# Patient Record
Sex: Female | Born: 1950 | Race: White | Hispanic: No | Marital: Single | Smoking: Never smoker
Health system: Southern US, Community
[De-identification: ages and names within clinical notes are randomized; demographics above are authoritative.]

## PROBLEM LIST (undated history)

## (undated) DIAGNOSIS — K5790 Diverticulosis of intestine, part unspecified, without perforation or abscess without bleeding: Secondary | ICD-10-CM

## (undated) DIAGNOSIS — Z8701 Personal history of pneumonia (recurrent): Secondary | ICD-10-CM

## (undated) DIAGNOSIS — K219 Gastro-esophageal reflux disease without esophagitis: Secondary | ICD-10-CM

## (undated) HISTORY — DX: Diverticulosis of intestine, part unspecified, without perforation or abscess without bleeding: K57.90

## (undated) HISTORY — DX: Gastro-esophageal reflux disease without esophagitis: K21.9

---
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
## 1989-08-08 HISTORY — PX: RETINAL DETACHMENT SURGERY: SHX105

## 1989-08-08 HISTORY — PX: EYE SURGERY: SHX253

## 2000-03-20 ENCOUNTER — Encounter: Payer: Self-pay | Admitting: *Deleted

## 2000-03-20 ENCOUNTER — Inpatient Hospital Stay (HOSPITAL_COMMUNITY): Admission: AD | Admit: 2000-03-20 | Discharge: 2000-03-20 | Payer: Self-pay | Admitting: *Deleted

## 2000-04-06 ENCOUNTER — Encounter (INDEPENDENT_AMBULATORY_CARE_PROVIDER_SITE_OTHER): Payer: Self-pay

## 2000-04-06 ENCOUNTER — Other Ambulatory Visit: Admission: RE | Admit: 2000-04-06 | Discharge: 2000-04-06 | Payer: Self-pay | Admitting: *Deleted

## 2006-07-20 ENCOUNTER — Ambulatory Visit (HOSPITAL_COMMUNITY): Admission: RE | Admit: 2006-07-20 | Discharge: 2006-07-20 | Payer: Self-pay | Admitting: Family Medicine

## 2006-08-08 HISTORY — PX: CATARACT EXTRACTION: SUR2

## 2009-08-06 ENCOUNTER — Ambulatory Visit: Payer: Self-pay | Admitting: Internal Medicine

## 2009-08-06 DIAGNOSIS — K5732 Diverticulitis of large intestine without perforation or abscess without bleeding: Secondary | ICD-10-CM | POA: Insufficient documentation

## 2009-08-06 LAB — CONVERTED CEMR LAB
BUN: 10 mg/dL (ref 6–23)
Basophils Absolute: 0.2 10*3/uL — ABNORMAL HIGH (ref 0.0–0.1)
Basophils Relative: 1.7 % (ref 0.0–3.0)
CO2: 27 meq/L (ref 19–32)
Calcium: 9.4 mg/dL (ref 8.4–10.5)
Chloride: 108 meq/L (ref 96–112)
Creatinine, Ser: 1 mg/dL (ref 0.4–1.2)
Eosinophils Absolute: 0.1 10*3/uL (ref 0.0–0.7)
Eosinophils Relative: 0.6 % (ref 0.0–5.0)
GFR calc non Af Amer: 60.32 mL/min (ref >60)
Glucose, Bld: 110 mg/dL — ABNORMAL HIGH (ref 70–99)
HCT: 46.1 % — ABNORMAL HIGH (ref 36.0–46.0)
Hemoglobin: 15.7 g/dL — ABNORMAL HIGH (ref 12.0–15.0)
Lymphocytes Relative: 13.4 % (ref 12.0–46.0)
Lymphs Abs: 1.8 10*3/uL (ref 0.7–4.0)
MCHC: 34.1 g/dL (ref 30.0–36.0)
MCV: 96.6 fL (ref 78.0–100.0)
Monocytes Absolute: 1 10*3/uL (ref 0.1–1.0)
Monocytes Relative: 7.4 % (ref 3.0–12.0)
Neutro Abs: 10.4 10*3/uL — ABNORMAL HIGH (ref 1.4–7.7)
Neutrophils Relative %: 76.9 % (ref 43.0–77.0)
Platelets: 234 10*3/uL (ref 150.0–400.0)
Potassium: 4 meq/L (ref 3.5–5.1)
RBC: 4.78 M/uL (ref 3.87–5.11)
RDW: 11.8 % (ref 11.5–14.6)
Sodium: 143 meq/L (ref 135–145)
WBC: 13.5 10*3/uL — ABNORMAL HIGH (ref 4.5–10.5)

## 2009-10-28 ENCOUNTER — Ambulatory Visit: Payer: Self-pay | Admitting: Internal Medicine

## 2009-10-29 LAB — CONVERTED CEMR LAB
ALT: 32 units/L (ref 0–35)
AST: 25 units/L (ref 0–37)
Albumin: 3.8 g/dL (ref 3.5–5.2)
Alkaline Phosphatase: 74 units/L (ref 39–117)
BUN: 13 mg/dL (ref 6–23)
Basophils Absolute: 0 10*3/uL (ref 0.0–0.1)
Basophils Relative: 0.5 % (ref 0.0–3.0)
Bilirubin Urine: NEGATIVE
Bilirubin, Direct: 0.1 mg/dL (ref 0.0–0.3)
CO2: 26 meq/L (ref 19–32)
Calcium: 9.2 mg/dL (ref 8.4–10.5)
Chloride: 110 meq/L (ref 96–112)
Cholesterol: 174 mg/dL (ref 0–200)
Creatinine, Ser: 1.1 mg/dL (ref 0.4–1.2)
Eosinophils Absolute: 0.1 10*3/uL (ref 0.0–0.7)
Eosinophils Relative: 1.7 % (ref 0.0–5.0)
GFR calc non Af Amer: 54 mL/min (ref >60)
Glucose, Bld: 129 mg/dL — ABNORMAL HIGH (ref 70–99)
HCT: 43.5 % (ref 36.0–46.0)
HDL: 47.9 mg/dL (ref >39.00)
Hemoglobin: 14.7 g/dL (ref 12.0–15.0)
Ketones, ur: NEGATIVE mg/dL
LDL Cholesterol: 107 mg/dL — ABNORMAL HIGH (ref 0–99)
Leukocytes, UA: NEGATIVE
Lymphocytes Relative: 34.9 % (ref 12.0–46.0)
Lymphs Abs: 2 10*3/uL (ref 0.7–4.0)
MCHC: 33.7 g/dL (ref 30.0–36.0)
MCV: 97.8 fL (ref 78.0–100.0)
Monocytes Absolute: 0.5 10*3/uL (ref 0.1–1.0)
Monocytes Relative: 8.4 % (ref 3.0–12.0)
Neutro Abs: 3.2 10*3/uL (ref 1.4–7.7)
Neutrophils Relative %: 54.5 % (ref 43.0–77.0)
Nitrite: NEGATIVE
Platelets: 210 10*3/uL (ref 150.0–400.0)
Potassium: 4.2 meq/L (ref 3.5–5.1)
RBC: 4.45 M/uL (ref 3.87–5.11)
RDW: 11.6 % (ref 11.5–14.6)
Sodium: 141 meq/L (ref 135–145)
Specific Gravity, Urine: 1.01 (ref 1.000–1.030)
TSH: 2.97 microintl units/mL (ref 0.35–5.50)
Total Bilirubin: 0.5 mg/dL (ref 0.3–1.2)
Total CHOL/HDL Ratio: 4
Total Protein, Urine: NEGATIVE mg/dL
Total Protein: 6.9 g/dL (ref 6.0–8.3)
Triglycerides: 96 mg/dL (ref 0.0–149.0)
Urine Glucose: NEGATIVE mg/dL
Urobilinogen, UA: 0.2 (ref 0.0–1.0)
VLDL: 19.2 mg/dL (ref 0.0–40.0)
WBC: 5.8 10*3/uL (ref 4.5–10.5)
pH: 6 (ref 5.0–8.0)

## 2009-11-04 ENCOUNTER — Ambulatory Visit: Payer: Self-pay | Admitting: Internal Medicine

## 2009-11-04 LAB — CONVERTED CEMR LAB: Hgb A1c MFr Bld: 6.2 % (ref 4.6–6.5)

## 2009-11-06 ENCOUNTER — Encounter (INDEPENDENT_AMBULATORY_CARE_PROVIDER_SITE_OTHER): Payer: Self-pay | Admitting: *Deleted

## 2010-01-27 ENCOUNTER — Ambulatory Visit: Payer: Self-pay | Admitting: Internal Medicine

## 2010-03-04 ENCOUNTER — Ambulatory Visit: Payer: Self-pay | Admitting: Internal Medicine

## 2010-03-04 LAB — CONVERTED CEMR LAB: Hgb A1c MFr Bld: 6 % (ref 4.6–6.5)

## 2010-03-10 ENCOUNTER — Encounter: Admission: RE | Admit: 2010-03-10 | Discharge: 2010-05-06 | Payer: Self-pay | Admitting: Internal Medicine

## 2010-03-10 ENCOUNTER — Encounter: Payer: Self-pay | Admitting: Internal Medicine

## 2010-06-10 ENCOUNTER — Telehealth: Payer: Self-pay | Admitting: Internal Medicine

## 2010-06-10 ENCOUNTER — Ambulatory Visit: Payer: Self-pay | Admitting: Internal Medicine

## 2010-06-10 LAB — CONVERTED CEMR LAB: Hgb A1c MFr Bld: 6 % (ref 4.6–6.5)

## 2010-06-15 ENCOUNTER — Ambulatory Visit: Payer: Self-pay | Admitting: Internal Medicine

## 2010-06-15 DIAGNOSIS — E119 Type 2 diabetes mellitus without complications: Secondary | ICD-10-CM | POA: Insufficient documentation

## 2010-09-07 NOTE — Assessment & Plan Note (Signed)
Summary: 3 mth fu--stc   Vital Signs:  Patient profile:   60 year old female Height:      66 inches (167.64 cm) Weight:      235.0 pounds (106.82 kg) O2 Sat:      97 % on Room air Temp:     97.3 degrees F (36.28 degrees C) oral Pulse rate:   69 / minute BP sitting:   124 / 78  (left arm) Cuff size:   large  Vitals Entered By: Orlan Leavens (January 27, 2010 8:41 AM)  O2 Flow:  Room air CC: 3 month follow-up Is Patient Diabetic? No Pain Assessment Patient in pain? no        Primary Care Provider:  Newt Lukes MD  CC:  3 month follow-up.  History of Present Illness: here for followup -   1) hyperglycemia -  noted high fasting glc on CPX labs 10/2009-  value meets criteria for DM -  +FH hx same -  +weight loss efforts and diet reviewed -   2) ?about zostavax   Preventive Screening-Counseling & Management  Alcohol-Tobacco     Alcohol drinks/day: 0     Alcohol Counseling: not indicated; patient does not drink     Smoking Status: never     Tobacco Counseling: not indicated; no tobacco use  Caffeine-Diet-Exercise     Diet Counseling: to improve diet; diet is suboptimal     Does Patient Exercise: no     Exercise Counseling: to improve exercise regimen     Depression Counseling: not indicated; screening negative for depression  Safety-Violence-Falls     Seat Belt Use: yes     Helmet Use: n/a     Firearms in the Home: no firearms in the home     Smoke Detectors: yes     Violence in the Home: no risk noted     Sexual Abuse: no     Fall Risk Counseling: not indicated; no significant falls noted  Clinical Review Panels:  Lipid Management   Cholesterol:  174 (10/28/2009)   LDL (bad choesterol):  107 (10/28/2009)   HDL (good cholesterol):  47.90 (10/28/2009)  Diabetes Management   HgBA1C:  6.2 (11/04/2009)   Creatinine:  1.1 (10/28/2009)   Current Medications (verified): 1)  Multivitamins  Tabs (Multiple Vitamin) .... Once Daily 2)  Calcium-Vitamin D  600-200 Mg-Unit Caps (Calcium Carbonate-Vitamin D) .... Once Daily 3)  Probiotic  Caps (Probiotic Product) .... Take 1 By Mouth Qd 4)  Natural Water Pills  Tabs (Bucalfaspkgluccouchparsuvaurju) .... Take Prn  Allergies (verified): 1)  ! Novocain  Past History:  Past Medical History: cataracts, hx of hx diverticulosis, recurrent bordeline diabetes  Review of Systems  The patient denies anorexia, weight gain, hoarseness, chest pain, and headaches.    Physical Exam  General:  overweight-appearing.  alert, well-developed, well-nourished, and cooperative to examination.    Eyes:  vision grossly intact; pupils equal, round and reactive to light.  conjunctiva and lids normal.    Ears:  normal pinnae bilaterally, without erythema, swelling, or tenderness to palpation. TMs clear, without effusion, or cerumen impaction. Hearing grossly normal bilaterally  Mouth:  teeth and gums in good repair; mucous membranes moist, without lesions or ulcers. oropharynx clear without exudate, no erythema.  Lungs:  normal respiratory effort, no intercostal retractions or use of accessory muscles; normal breath sounds bilaterally - no crackles and no wheezes.    Heart:  normal rate, regular rhythm, no murmur, and no rub. BLE trace  edema ("fatty ankles").    Impression & Recommendations:  Problem # 1:  FASTING HYPERGLYCEMIA (ICD-790.29)  prior a1c 6.2 - borderline DM - pt working on weight loss - down 11 lbs in 3 mos - recheck in July and refer to nutrition  Labs Reviewed: Creat: 1.1 (10/28/2009)     Orders: Nutrition Referral (Nutrition)  Problem # 2:  other Time spent with patient 25 minutes, more than 50% of this time was spent counseling patient on diet and caloric needs for weight loss and carb restriction  Complete Medication List: 1)  Multivitamins Tabs (Multiple vitamin) .... Once daily 2)  Calcium-vitamin D 600-200 Mg-unit Caps (Calcium carbonate-vitamin d) .... Once daily 3)  Probiotic  Caps (Probiotic product) .... Take 1 by mouth qd 4)  Natural Water Pills Tabs (Bucalfaspkgluccouchparsuvaurju) .... Take prn  Patient Instructions: 1)  it was good to see you today. 2)  we'll make referral to nutrition for education (in addition to rough outline given to you today). Our office will contact you regarding this appointment once made.  3)  return for lab only in July (a1c) - your results will be posted on the phone tree for review in 48-72 hours from the time of test completion; call [phone] and enter your 9 digit MRN (listed above on this page, just below your name); if any changes need to be made or there are abnormal results, you will be contacted directly.  4)  Please schedule a follow-up appointment in Nov-Dec to monitor weight and sugars, call sooner if problems.

## 2010-09-07 NOTE — Assessment & Plan Note (Signed)
Summary: NOV-DEC FU D/T--STC   Vital Signs:  Patient profile:   60 year old female Height:      66 inches (167.64 cm) Weight:      230.0 pounds (104.55 kg) BMI:     37.26 O2 Sat:      97 % on Room air Temp:     98.5 degrees F (36.94 degrees C) oral Pulse rate:   63 / minute BP sitting:   118 / 72  (left arm) Cuff size:   large  Vitals Entered By: Orlan Leavens RMA (June 15, 2010 9:13 AM)  O2 Flow:  Room air CC: 3-4 month follow-up Is Patient Diabetic? No Pain Assessment Patient in pain? no        Primary Care Provider:  Newt Lukes MD  CC:  3-4 month follow-up.  History of Present Illness: here for followup -   1) diet controlled DM2 dx by high fasting glc on CPX labs 10/2009-  +FH hx same -  +weight loss efforts and diet reviewed -      Current Medications (verified): 1)  Multivitamins  Tabs (Multiple Vitamin) .... Once Daily 2)  Calcium-Vitamin D 600-200 Mg-Unit Caps (Calcium Carbonate-Vitamin D) .... Once Daily 3)  Probiotic  Caps (Probiotic Product) .... Take 1 By Mouth Qd 4)  Natural Water Pills  Tabs (Bucalfaspkgluccouchparsuvaurju) .... Take Prn  Allergies (verified): 1)  ! Novocain  Past History:  Past Medical History: cataracts, hx of hx diverticulosis, recurrent diabetes type 2, diet controlled  Review of Systems  The patient denies weight gain, chest pain, syncope, headaches, and abdominal pain.    Physical Exam  General:  overweight-appearing.  alert, well-developed, well-nourished, and cooperative to examination.    Lungs:  normal respiratory effort, no intercostal retractions or use of accessory muscles; normal breath sounds bilaterally - no crackles and no wheezes.    Heart:  normal rate, regular rhythm, no murmur, and no rub. BLE trace edema ("fatty ankles").    Impression & Recommendations:  Problem # 1:  DIABETES MELLITUS, TYPE II, CONTROLLED (ICD-250.00)  diet controlled and improved since dx 10/2009 when a1c 6.2 cont  diet and exercsie efforts - good meeting with nutritionist  Labs Reviewed: Creat: 1.1 (10/28/2009)    Reviewed HgBA1c results: 6.0 (06/10/2010)  6.0 (03/04/2010)  Complete Medication List: 1)  Multivitamins Tabs (Multiple vitamin) .... Once daily 2)  Calcium-vitamin D 600-200 Mg-unit Caps (Calcium carbonate-vitamin d) .... Once daily 3)  Probiotic Caps (Probiotic product) .... Take 1 by mouth qd 4)  Natural Water Pills Tabs (Bucalfaspkgluccouchparsuvaurju) .... Take prn  Patient Instructions: 1)  it was good to see you today. 2)  you are doing well with your weight loss - keep up the good work and try to pick up the exercise effort - walk the dogs, etc 3)  Please schedule a follow-up appointment in 3 months to monitor weight and sugars, call sooner if problems.  will do lab at that visit   Orders Added: 1)  Est. Patient Level III [16109]

## 2010-09-07 NOTE — Progress Notes (Signed)
Summary: lab orders?  Phone Note From Other Clinic   Caller: Vicki/ lab/ [phone] Request: Talk with Nurse Summary of Call: Pt is down in the lab requesting blood work no orders in comp.  Initial call taken by: Orlan Leavens RMA,  June 10, 2010 8:28 AM  Follow-up for Phone Call        d/w Valentina Gu - will check a1c (790.29) - already entered into IDX - plan to review with pt at upcoming OV Follow-up by: Newt Lukes MD,  June 10, 2010 8:48 AM

## 2010-09-07 NOTE — Assessment & Plan Note (Signed)
Summary: PHYSICAL--STC   Vital Signs:  Patient profile:   60 year old female Height:      66 inches (167.64 cm) Weight:      246.8 pounds (112.18 kg) O2 Sat:      94 % on Room air Temp:     97.5 degrees F (36.39 degrees C) oral Pulse rate:   94 / minute BP sitting:   110 / 72  (left arm) Cuff size:   large  Vitals Entered By: Orlan Leavens (November 04, 2009 8:25 AM)  O2 Flow:  Room air CC: CPX Is Patient Diabetic? No Pain Assessment Patient in pain? no        Primary Care Provider:  Newt Lukes MD  CC:  CPX.  History of Present Illness: patient is here today for annual physical. Patient feels well and has no complaints.   noted high fasting glc on CPX labs -  value meets criteria for DM -  +FH hx same -  +weight gain and poor diet reviewed -  ?re: borderline DM prev  Preventive Screening-Counseling & Management  Alcohol-Tobacco     Alcohol drinks/day: 0     Alcohol Counseling: not indicated; patient does not drink     Smoking Status: never     Tobacco Counseling: not indicated; no tobacco use  Caffeine-Diet-Exercise     Diet Counseling: to improve diet; diet is suboptimal     Does Patient Exercise: no     Exercise Counseling: to improve exercise regimen     Depression Counseling: not indicated; screening negative for depression  Safety-Violence-Falls     Seat Belt Use: yes     Helmet Use: n/a     Firearms in the Home: no firearms in the home     Smoke Detectors: yes     Violence in the Home: no risk noted     Sexual Abuse: no     Fall Risk Counseling: not indicated; no significant falls noted  Clinical Review Panels:  Immunizations   Last Tetanus Booster:  Tdap (11/04/2009)  Lipid Management   Cholesterol:  174 (10/28/2009)   LDL (bad choesterol):  107 (10/28/2009)   HDL (good cholesterol):  47.90 (10/28/2009)  CBC   WBC:  5.8 (10/28/2009)   RBC:  4.45 (10/28/2009)   Hgb:  14.7 (10/28/2009)   Hct:  43.5 (10/28/2009)   Platelets:  210.0  (10/28/2009)   MCV  97.8 (10/28/2009)   MCHC  33.7 (10/28/2009)   RDW  11.6 (10/28/2009)   PMN:  54.5 (10/28/2009)   Lymphs:  34.9 (10/28/2009)   Monos:  8.4 (10/28/2009)   Eosinophils:  1.7 (10/28/2009)   Basophil:  0.5 (10/28/2009)  Complete Metabolic Panel   Glucose:  129 (10/28/2009)   Sodium:  141 (10/28/2009)   Potassium:  4.2 (10/28/2009)   Chloride:  110 (10/28/2009)   CO2:  26 (10/28/2009)   BUN:  13 (10/28/2009)   Creatinine:  1.1 (10/28/2009)   Albumin:  3.8 (10/28/2009)   Total Protein:  6.9 (10/28/2009)   Calcium:  9.2 (10/28/2009)   Total Bili:  0.5 (10/28/2009)   Alk Phos:  74 (10/28/2009)   SGPT (ALT):  32 (10/28/2009)   SGOT (AST):  25 (10/28/2009)   Current Medications (verified): 1)  Multivitamins  Tabs (Multiple Vitamin) .... Once Daily 2)  Calcium-Vitamin D 600-200 Mg-Unit Caps (Calcium Carbonate-Vitamin D) .... Once Daily 3)  Probiotic  Caps (Probiotic Product) .... Take 1 By Mouth Qd 4)  Natural Water Pills  Tabs (  Bucalfaspkgluccouchparsuvaurju) .... Take Prn  Allergies (verified): 1)  ! Novocain  Past History:  Past Medical History: cataracts, hx of hx diverticulosis, recurrent  Past Surgical History: Eye surgery, detached retina 1991 Cataract extraction (2008) Tonsillectomy (1957)     Family History: Family History of Arthritis (parent,grandparent) Family History Diabetes 1st degree relative (grandparent) Family History Lung cancer (parent)  dad expired age39y - lung cancer (worked Designer, fashion/clothing), melanoma mom -[employer] - OA (?RA), DM2     Social History: Never Smoked   no alcohol single - lives alone, 2 dogs works in Airline pilot, Copy for IT trainer [employer] Does Patient Exercise:  no Risk analyst Use:  yes  Review of Systems       see HPI above. I have reviewed all other systems and they were negative.   Physical Exam  General:  overweight-appearing.  alert, well-developed, well-nourished, and cooperative to examination.      Head:  Normocephalic and atraumatic without obvious abnormalities. No apparent alopecia or balding. Eyes:  vision grossly intact; pupils equal, round and reactive to light.  conjunctiva and lids normal.    Ears:  normal pinnae bilaterally, without erythema, swelling, or tenderness to palpation. TMs clear, without effusion, or cerumen impaction. Hearing grossly normal bilaterally  Mouth:  teeth and gums in good repair; mucous membranes moist, without lesions or ulcers. oropharynx clear without exudate, no erythema.  Neck:  supple, full ROM, no masses, no thyromegaly; no thyroid nodules or tenderness. no JVD or carotid bruits.   Lungs:  normal respiratory effort, no intercostal retractions or use of accessory muscles; normal breath sounds bilaterally - no crackles and no wheezes.    Heart:  normal rate, regular rhythm, no murmur, and no rub. BLE trace edema ("fatty ankles"). normal DP pulses and normal cap refill in all 4 extremities    Abdomen:  obese, soft, non-tender, normal bowel sounds, no distention; no masses and no appreciable hepatomegaly or splenomegaly.   Genitalia:  defer at pt request Msk:  No deformity or scoliosis noted of thoracic or lumbar spine.   Neurologic:  alert & oriented X3 and cranial nerves II-XII symetrically intact.  strength normal in all extremities, sensation intact to light touch, and gait normal. speech fluent without dysarthria or aphasia; follows commands with good comprehension.  Skin:  no rashes, vesicles, ulcers, or erythema. No nodules or irregularity to palpation.  Psych:  Oriented X3, memory intact for recent and remote, normally interactive, good eye contact, not anxious appearing, not depressed appearing, and not agitated.      Impression & Recommendations:  Problem # 1:  PREVENTIVE HEALTH CARE (ICD-V70.0) Patient has been counseled on age-appropriate routine health concerns for screening and prevention. These are reviewed and up-to-date. Immunizations are  up-to-date or declined. Labs and ECG reviewed. refer for colo, declines mammo/PAP Orders: EKG w/ Interpretation (93000) Gastroenterology Referral (GI)  Problem # 2:  FASTING HYPERGLYCEMIA (ICD-790.29)  meets criteria for DM - education provided on diet and need for weight loss -  wished to avoid meds at this time check A1c and reck 3 mos  Orders: TLB-A1C / Hgb A1C (Glycohemoglobin) (83036-A1C)  Complete Medication List: 1)  Multivitamins Tabs (Multiple vitamin) .... Once daily 2)  Calcium-vitamin D 600-200 Mg-unit Caps (Calcium carbonate-vitamin d) .... Once daily 3)  Probiotic Caps (Probiotic product) .... Take 1 by mouth qd 4)  Natural Water Pills Tabs (Bucalfaspkgluccouchparsuvaurju) .... Take prn  Other Orders: Tdap => 12yrs IM (42595) Admin 1st Vaccine (63875)  Contraindications/Deferment of Procedures/Staging:  Test/Procedure: Mammogram    Reason for deferment: patient declined     Test/Procedure: PAP Smear    Reason for deferment: patient declined   Patient Instructions: 1)  it was good to see you today. 2)  exam and EKG look good - high sugar meets criteria for diabetes -  will check A1c now to confirm same and call you with results 3)  it is important that you work on losing weight - monitor your diet and consume fewer calories such as less carbohydrates (sugar) and less fat. you also need to increase your physical activity level - start by walking for 10-20 minutes 3 times per week and work up to 30 minutes 4-5 times each week.  4)  we'll make referral for colonoscopy. Our office will contact you regarding this appointment once made.  5)  Please schedule a follow-up appointment in 3 months, sooner if problems.    Immunizations Administered:  Tetanus Vaccine:    Vaccine Type: Tdap    Site: left deltoid    Mfr: Sanofi Pasteur    Dose: 0.5 ml    Route: IM    Given by: Orlan Leavens    Exp. Date: 10/29/2011    Lot #: [id number]    VIS given: 11/04/09

## 2010-09-07 NOTE — Letter (Signed)
Summary: [employer]   Imported By: Lester Hector 03/15/2010 10:09:00  _____________________________________________________________________  External Attachment:    Type:   Image     Comment:   External Document

## 2010-09-07 NOTE — Letter (Signed)
Summary: Previsit letter  [facility]  [address] [place], [place] [address]   Phone: [phone]  Fax: [phone]       11/06/2009 MRN: [id number]  [facility] [address] [place], [place]  [address]  Dear [name],  Welcome to the Gastroenterology Division at [facility].    You are scheduled to see a nurse for your pre-procedure visit on 12-15-09 at 8:00a.m. on the 3rd floor at [facility], [address].  We ask that you try to arrive at our office 15 minutes prior to your appointment time to allow for check-in.  Your nurse visit will consist of discussing your medical and surgical history, your immediate family medical history, and your medications.    Please bring a complete list of all your medications or, if you prefer, bring the medication bottles and we will list them.  We will need to be aware of both prescribed and over the counter drugs.  We will need to know exact dosage information as well.  If you are on blood thinners (Coumadin, Plavix, Aggrenox, Ticlid, etc.) please call our office today/prior to your appointment, as we need to consult with your physician about holding your medication.   Please be prepared to read and sign documents such as consent forms, a financial agreement, and acknowledgement forms.  If necessary, and with your consent, a friend or relative is welcome to sit-in on the nurse visit with you.  Please bring your insurance card so that we may make a copy of it.  If your insurance requires a referral to see a specialist, please bring your referral form from your primary care physician.  No co-pay is required for this nurse visit.     If you cannot keep your appointment, please call [phone] to cancel or reschedule prior to your appointment date.  This allows [place] the opportunity to schedule an appointment for another patient in need of care.    Thank you for choosing [facility] for your medical needs.   We appreciate the opportunity to care for you.  Please visit [place] at our website  to learn more about our practice.                     Sincerely.                                                                                                                   The Gastroenterology Division

## 2010-10-12 ENCOUNTER — Ambulatory Visit: Payer: Self-pay | Admitting: Internal Medicine

## 2010-12-02 ENCOUNTER — Ambulatory Visit: Payer: Self-pay | Admitting: Internal Medicine

## 2010-12-29 ENCOUNTER — Encounter: Payer: Self-pay | Admitting: Internal Medicine

## 2010-12-29 ENCOUNTER — Ambulatory Visit (INDEPENDENT_AMBULATORY_CARE_PROVIDER_SITE_OTHER): Payer: 59 | Admitting: Internal Medicine

## 2010-12-29 VITALS — BP 120/82 | HR 98 | Temp 98.8°F | Ht 66.0 in | Wt 233.0 lb

## 2010-12-29 DIAGNOSIS — J9801 Acute bronchospasm: Secondary | ICD-10-CM

## 2010-12-29 DIAGNOSIS — J189 Pneumonia, unspecified organism: Secondary | ICD-10-CM

## 2010-12-29 MED ORDER — AZITHROMYCIN 250 MG PO TABS: 250.0000 mg | ORAL_TABLET | Freq: Every day | ORAL | Status: DC

## 2010-12-29 MED ORDER — METHYLPREDNISOLONE ACETATE 80 MG/ML IJ SUSP
120.0000 mg | Freq: Once | INTRAMUSCULAR | Status: AC
  Administered 2010-12-29: 120 mg via INTRAMUSCULAR

## 2010-12-29 NOTE — Progress Notes (Signed)
  Subjective:    Patient ID: [name], female    DOB: [birth year], 60 y.o.   MRN: [id number]  HPI  complains of cough Onset 5 days ago - progressive course Describes as deep, dry "barking" cough - no sputum -  no chest or nasal congestion, no HA, mild-mod fatigue since onset associated with fever and chills, but no dyspnea on exertion or shortness of breath  No nausea and vomiting, no sick contacts Tdap given last in 10/2009 - no travel  Past Medical History  Diagnosis Date  . Cataract   . Diverticulosis   . Diabetes mellitus     Review of Systems  Constitutional: Negative for unexpected weight change.  HENT: Negative for neck pain.   Cardiovascular: Negative for chest pain and leg swelling.  Psychiatric/Behavioral: Negative for confusion.       Objective:   Physical Exam BP 120/82  Pulse 98  Temp(Src) 98.8 F (37.1 C) (Oral)  Ht 5\' 6"  (1.676 m)  Wt 233 lb (105.688 kg)  BMI 37.61 kg/m2  SpO2 96% Physical Exam  Constitutional: She is oriented to person, place, and time. She appears well-developed and well-nourished. No distress.  HENT: Head: Normocephalic and atraumatic. Ears; B TMs ok, no erythema or effusion; Nose: Nose normal.  Mouth/Throat: Oropharynx is clear and moist. No oropharyngeal exudate.  Eyes: Conjunctivae and EOM are normal. Pupils are equal, round, and reactive to light. No scleral icterus.  Neck: Normal range of motion. Neck supple. No JVD present. No thyromegaly present.  Cardiovascular: Normal rate, regular rhythm and normal heart sounds.  No murmur heard. No BLE edema. Pulmonary/Chest: Ongoing barking cough spasms precipitated by talking/laughing; resting effort normal and breath sounds with exp wheeze.  Psychiatric: She has a normal mood and affect. Her behavior is normal. Judgment and thought content normal.       Lab Results  Component Value Date   WBC 5.8 10/28/2009   HGB 14.7 10/28/2009   HCT 43.5 10/28/2009   PLT 210.0 10/28/2009   CHOL  174 10/28/2009   TRIG 96.0 10/28/2009   HDL 47.90 10/28/2009   ALT 32 10/28/2009   AST 25 10/28/2009   NA 141 10/28/2009   K 4.2 10/28/2009   CL 110 10/28/2009   CREATININE 1.1 10/28/2009   BUN 13 10/28/2009   CO2 26 10/28/2009   TSH 2.97 10/28/2009   HGBA1C 6.0 06/10/2010      Assessment & Plan:  Atypical pneumonia - associated with significant bronchospasm - doubt pertussis given vaccine against same 10/2009- tx with macrolide for atypical organisms and Depomedrol given for associated bronchospasm today - cont OTC cough suppressant and antipyretics as needed - too call if continued symptoms or problems - pt agrees (erx Zpak done)

## 2010-12-29 NOTE — Patient Instructions (Addendum)
It was good to see you today. antibiotics - Zpak for your infection - also steroid shot given today for cough and bronchospasm - Your prescription(s) have been submitted to your pharmacy. Please take as directed and contact our office if you believe you are having problem(s) with the medication(s). Continue Tussin CF as needed and Aspirin, tylenol or ibuprofen as needed for ache and fevers  Please keep scheduled follow up as planned for diabetes, etc; call sooner if problems.

## 2010-12-31 ENCOUNTER — Encounter: Payer: Self-pay | Admitting: Internal Medicine

## 2010-12-31 ENCOUNTER — Telehealth: Payer: Self-pay

## 2010-12-31 ENCOUNTER — Ambulatory Visit (INDEPENDENT_AMBULATORY_CARE_PROVIDER_SITE_OTHER): Payer: 59 | Admitting: Internal Medicine

## 2010-12-31 DIAGNOSIS — R209 Unspecified disturbances of skin sensation: Secondary | ICD-10-CM

## 2010-12-31 DIAGNOSIS — R200 Anesthesia of skin: Secondary | ICD-10-CM

## 2010-12-31 DIAGNOSIS — J40 Bronchitis, not specified as acute or chronic: Secondary | ICD-10-CM

## 2010-12-31 DIAGNOSIS — G459 Transient cerebral ischemic attack, unspecified: Secondary | ICD-10-CM

## 2010-12-31 MED ORDER — DIAZEPAM 10 MG PO TABS: 10.0000 mg | ORAL_TABLET | ORAL | Status: AC | PRN

## 2010-12-31 MED ORDER — AMOXICILLIN 500 MG PO CAPS: 500.0000 mg | ORAL_CAPSULE | Freq: Three times a day (TID) | ORAL | Status: AC

## 2010-12-31 MED ORDER — ASPIRIN EC 325 MG PO TBEC: 325.0000 mg | DELAYED_RELEASE_TABLET | Freq: Every day | ORAL | Status: DC

## 2010-12-31 NOTE — Telephone Encounter (Signed)
[facility] Triage Call Report Triage Record Num: [id number] Operator: Hillary Bow Patient Name: [name] Call Date & Time: 12/30/2010 6:04:37PM Patient Phone: [phone] PCP: Rene Paci Patient Gender: Female PCP Fax : [phone] Patient DOB: Jan 06, 1951 [facility] Name: [facility] Reason for Call: Pt calling about having tingling in Face and L Arm and L Leg, onset 12-30-10. Pt started Zpack on 12-29-10 for URI. Pt is currenlty not experiencing tingling. Advised Pt to c/b and hit emergent option if sxs returned. Pt verbalized understanding. Protocol(s) Used: Office Note Recommended Outcome per Protocol: Information Noted and Sent to Office Reason for Outcome: Caller information to office Care Advice: ~ 12/30/2010 6:12:05PM Page 1 of 1 [email or web address]

## 2010-12-31 NOTE — Progress Notes (Signed)
  Subjective:    Patient ID: [name], female    DOB: 1950/09/16, 60 y.o.   MRN: [id number]  HPI  complains of L side "tingling" Onset yesterday afternoon while driving home Started in face, then to LUE and LLE Lasted approx with gradual resolution - but has recurred x 2 since then - No weakness, trouble speaking - no headache, no falls or balance problems No symptoms at present  Has taken 2 days of Zpak for cough- atypical PNA -?side effect of abx or steroid shot?  Past Medical History  Diagnosis Date  . Cataract   . Diverticulosis   . Diabetes mellitus     Review of Systems  Constitutional: Negative for fever.  Respiratory: Positive for cough (improved in last 48h s/p steroid shot and 2 d azith). Negative for shortness of breath.   Cardiovascular: Negative for chest pain.  Gastrointestinal: Negative for nausea.  Neurological: Negative for weakness, light-headedness and headaches.       Objective:   Physical Exam BP 112/82  Pulse 82  Temp(Src) 97.8 F (36.6 C) (Oral)  Ht 5\' 6"  (1.676 m)  SpO2 96% Physical Exam  Constitutional: She is oriented to person, place, and time. She appears well-developed and well-nourished. No distress.  HENT: Head: Normocephalic and atraumatic. Ears; B TMs ok, no erythema or effusion; Nose: Nose normal.  Mouth/Throat: Oropharynx is clear and moist. No oropharyngeal exudate.  Eyes: Conjunctivae and EOM are normal. Pupils are equal, round, and reactive to light. No scleral icterus.  Neck: Normal range of motion. Neck supple. No JVD present. No thyromegaly present. No carotid bruits bilaterally. Cardiovascular: Normal rate, regular rhythm and normal heart sounds.  No murmur heard. No BLE edema. Pulmonary/Chest: Effort normal and breath sounds normal. No respiratory distress. She has no wheezes.  Neurological: She is alert and oriented to person, place, and time. No cranial nerve deficit. Coordination normal. describes intermittent  parasthesia on L face and LUE/LLE Skin: no rash or erythema Psychiatric: She has a normal mood and affect. Her behavior is normal. Judgment and thought content normal.   Lab Results  Component Value Date   WBC 5.8 10/28/2009   HGB 14.7 10/28/2009   HCT 43.5 10/28/2009   PLT 210.0 10/28/2009   CHOL 174 10/28/2009   TRIG 96.0 10/28/2009   HDL 47.90 10/28/2009   ALT 32 10/28/2009   AST 25 10/28/2009   NA 141 10/28/2009   K 4.2 10/28/2009   CL 110 10/28/2009   CREATININE 1.1 10/28/2009   BUN 13 10/28/2009   CO2 26 10/28/2009   TSH 2.97 10/28/2009   HGBA1C 6.0 06/10/2010        Assessment & Plan:  L facial numbness and LUE/LLE paraesthesia - ?TIA or CVA - intermittent symptoms - add ASA, check MRI/MRA brain - doubt med rx but stop azith and change to amox for bronchitis (see next) - advised ER if persisiting or worsening symptoms while awaiting OP eval for same  Bronchitis - seen here for same 48h ago - improved s/p depomedrol and azith x 2of5d - stop Zpak and tx amox - erx done

## 2010-12-31 NOTE — Patient Instructions (Signed)
It was good to see you today. if your symptoms continue to worsen (weakness, trouble speaking. pain, fever, etc), or if you are unable take anything by mouth (pills, fluids, etc), you should go to the emergency room for further evaluation and treatment. Start Aspirin 325mg  daily until further testing we'll make referral for MRI/MRA brain. Our office will contact you regarding appointment(s) once made. Valium tablet before procedure as needed Stop Zpak and take amoxicllin for bronchitis - Your prescription(s) have been submitted to your pharmacy. Please take as directed and contact our office if you believe you are having problem(s) with the medication(s).  Your results will be called to you after review (48-72hours after test completion). If any changes need to be made, you will be notified at that time.

## 2011-01-04 ENCOUNTER — Telehealth: Payer: Self-pay

## 2011-01-04 NOTE — Telephone Encounter (Signed)
Pt informed and will continue ASA

## 2011-01-04 NOTE — Telephone Encounter (Signed)
Pt called stating that her TIA-type sxs have resolved over the weekend. Pt is requesting MD advisement on if she should proceed with having MRI scheduled? Please advise.

## 2011-01-04 NOTE — Telephone Encounter (Signed)
If symptoms resolved, no need to pursue test now - symptoms may have been related to med SE -  but if recurrent symptoms, needs prompt re-eval - would continue ASA daily indef for TIA prevention

## 2011-01-05 ENCOUNTER — Encounter: Payer: Self-pay | Admitting: Internal Medicine

## 2011-01-05 ENCOUNTER — Ambulatory Visit (INDEPENDENT_AMBULATORY_CARE_PROVIDER_SITE_OTHER): Payer: 59 | Admitting: Internal Medicine

## 2011-01-05 ENCOUNTER — Observation Stay (HOSPITAL_COMMUNITY)
Admission: AD | Admit: 2011-01-05 | Discharge: 2011-01-06 | Disposition: A | Discharge status: Home / Self Care | Payer: 59 | Source: Ambulatory Visit | Attending: Family Medicine | Admitting: Family Medicine

## 2011-01-05 ENCOUNTER — Inpatient Hospital Stay (HOSPITAL_COMMUNITY): Payer: 59

## 2011-01-05 VITALS — BP 112/80 | HR 93 | Temp 98.3°F | Ht 66.0 in

## 2011-01-05 DIAGNOSIS — R42 Dizziness and giddiness: Secondary | ICD-10-CM | POA: Insufficient documentation

## 2011-01-05 DIAGNOSIS — Z79899 Other long term (current) drug therapy: Secondary | ICD-10-CM | POA: Insufficient documentation

## 2011-01-05 DIAGNOSIS — J209 Acute bronchitis, unspecified: Secondary | ICD-10-CM | POA: Insufficient documentation

## 2011-01-05 DIAGNOSIS — I728 Aneurysm of other specified arteries: Secondary | ICD-10-CM | POA: Insufficient documentation

## 2011-01-05 DIAGNOSIS — R209 Unspecified disturbances of skin sensation: Principal | ICD-10-CM | POA: Insufficient documentation

## 2011-01-05 DIAGNOSIS — R200 Anesthesia of skin: Secondary | ICD-10-CM

## 2011-01-05 DIAGNOSIS — J0130 Acute sphenoidal sinusitis, unspecified: Secondary | ICD-10-CM | POA: Insufficient documentation

## 2011-01-05 DIAGNOSIS — G459 Transient cerebral ischemic attack, unspecified: Secondary | ICD-10-CM

## 2011-01-05 DIAGNOSIS — Z7982 Long term (current) use of aspirin: Secondary | ICD-10-CM | POA: Insufficient documentation

## 2011-01-05 DIAGNOSIS — E119 Type 2 diabetes mellitus without complications: Secondary | ICD-10-CM | POA: Insufficient documentation

## 2011-01-05 LAB — CBC
HCT: 46.4 % — ABNORMAL HIGH (ref 36.0–46.0)
Hemoglobin: 15.4 g/dL — ABNORMAL HIGH (ref 12.0–15.0)
MCH: 31.5 pg (ref 26.0–34.0)
MCHC: 33.2 g/dL (ref 30.0–36.0)
RBC: 4.89 MIL/uL (ref 3.87–5.11)

## 2011-01-05 LAB — URINALYSIS, ROUTINE W REFLEX MICROSCOPIC
Glucose, UA: NEGATIVE mg/dL
Leukocytes, UA: NEGATIVE
Protein, ur: NEGATIVE mg/dL
Specific Gravity, Urine: 1.011 (ref 1.005–1.030)
Urobilinogen, UA: 0.2 mg/dL (ref 0.0–1.0)

## 2011-01-05 LAB — COMPREHENSIVE METABOLIC PANEL
ALT: 20 U/L (ref 0–35)
AST: 17 U/L (ref 0–37)
Alkaline Phosphatase: 100 U/L (ref 39–117)
CO2: 27 mEq/L (ref 19–32)
Chloride: 106 mEq/L (ref 96–112)
GFR calc Af Amer: >60 mL/min (ref >60)
GFR calc non Af Amer: >60 mL/min (ref >60)
Glucose, Bld: 112 mg/dL — ABNORMAL HIGH (ref 70–99)
Sodium: 143 mEq/L (ref 135–145)
Total Bilirubin: 0.4 mg/dL (ref 0.3–1.2)

## 2011-01-05 LAB — URINE MICROSCOPIC-ADD ON

## 2011-01-05 LAB — PROTIME-INR
INR: 1 (ref 0.00–1.49)
Prothrombin Time: 13.4 seconds (ref 11.6–15.2)

## 2011-01-05 NOTE — Progress Notes (Signed)
Subjective:    Patient ID: [name], female    DOB: 1951/05/27, 60 y.o.   MRN: [id number]  HPI Here with left body numbness, recurrent Current symptoms began this AM 830ish, not resolving as before Intermittent hx same past 5 days -  Initial onset 12/31/10 Seen here for same 5/25 - arranging OP eval for ?TIA as pt without persisting symptoms that day and declined ER/hospital eval On ASA 325 since that time - numbness had improved - no symptoms last 48h until this AM - Now recurrent L face<LUE and LLE numbness this AM-  No weakness, no dysarthria - no balance problems No HA, visual changes or deficits  Current Outpatient Prescriptions on File Prior to Visit  Medication Sig Dispense Refill  . amoxicillin (AMOXIL) 500 MG capsule Take 1 capsule (500 mg total) by mouth 3 (three) times daily.  21 capsule  0  . aspirin EC 325 MG tablet Take 1 tablet (325 mg total) by mouth daily.  100 tablet  3  . BucAlfAspKGlucCouchParsUvaUrJu (NATURAL WATER PILLS) TABS Take 1 tablet by mouth daily.        Marland Kitchen CALCIUM CARBONATE-VIT D-MIN PO Take by mouth.        . Calcium Carbonate-Vitamin D (CALCIUM-VITAMIN D) 600-200 MG-UNIT CAPS Take 1 tablet by mouth daily.        . Multiple Vitamin (MULTIVITAMIN) tablet Take 1 tablet by mouth daily.        Marland Kitchen PROBIOTIC CAPS Take 1 capsule by mouth daily.           Past Medical History  Diagnosis Date  . Cataract   . Diverticulosis   . Diabetes mellitus    Family History  Problem Relation Age of Onset  . Arthritis Mother     Osteoarthritis  . Diabetes Mother     Type 2  . Cancer Father     Lung   History  Substance Use Topics  . Smoking status: Never Smoker   . Smokeless tobacco: Not on file   Comment: Single-lives alone, 2 dogs  . Alcohol Use: No     Review of Systems  Constitutional: Negative for fever.  Respiratory: Negative for cough and shortness of breath.   Cardiovascular: Negative for chest pain.  Gastrointestinal: Negative for abdominal  pain.  Musculoskeletal: Negative for gait problem.  Skin: Negative for rash.  Neurological: Negative for dizziness.  No other specific complaints in a complete review of systems (except as listed in HPI above).      Objective:   Physical Exam BP 112/80  Pulse 93  Temp(Src) 98.3 F (36.8 C) (Oral)  Ht 5\' 6"  (1.676 m)  SpO2 97%  Constitutional: She is oriented to person, place, and time. She appears well-developed and well-nourished. No distress.  HENT: Head: Normocephalic and atraumatic. Ears; B TMs ok, no erythema or effusion; Nose: Nose normal.  Mouth/Throat: Oropharynx is clear and moist. No oropharyngeal exudate.  Eyes: Conjunctivae and EOM are normal. Pupils are equal, round, and reactive to light. No scleral icterus.  Neck: Normal range of motion. Neck supple. No JVD present. No thyromegaly present. No carotid bruits bilaterally. Cardiovascular: Normal rate, regular rhythm and normal heart sounds.  No murmur heard. No BLE edema. Pulmonary/Chest: Effort normal and breath sounds normal. No respiratory distress. She has no wheezes.  Neurological: She is alert and oriented to person, place, and time. No cranial nerve deficit. Coordination normal. describes parasthesia on L face and LUE<LLE Skin: no rash or erythema Psychiatric: She has  a normal mood and affect. Her behavior is normal. Judgment and thought content normal.     Lab Results  Component Value Date   WBC 5.8 10/28/2009   HGB 14.7 10/28/2009   HCT 43.5 10/28/2009   PLT 210.0 10/28/2009   CHOL 174 10/28/2009   TRIG 96.0 10/28/2009   HDL 47.90 10/28/2009   ALT 32 10/28/2009   AST 25 10/28/2009   NA 141 10/28/2009   K 4.2 10/28/2009   CL 110 10/28/2009   CREATININE 1.1 10/28/2009   BUN 13 10/28/2009   CO2 26 10/28/2009   TSH 2.97 10/28/2009   HGBA1C 6.0 06/10/2010      Assessment & Plan:  See problem list. Medications and labs reviewed today.  L facial numbness and LUE/LLE paraesthesia -  Suspect TIA - intermittent  symptoms ongoing since 12/31/10 - on ASA 325 qd, scheduled for MRI/MRA brain but given acceleration of symptoms in last 12h, pt agrees to hospitalization now (declines EMS, friend driving her - call to admitting hospitalist Dr. Irene Limbo re: same and discussed hx/current exam, holding orders written) - doubt med rxn to azith or depomedrol at this time as previously speculated, completed amox for prior bronchitis symptoms  -  Bronchitis - seen OV for same 5/23 - improved s/p depomedrol and azith x 2d - then stopped Zpak and tx amox due to numbness- antibiotics course now done, all pulm symptoms resolved  DM2 - diet controlled, no meds

## 2011-01-05 NOTE — Patient Instructions (Signed)
Go to [name] admitting now - Dr. Irene Limbo will admit you for testing as discussed

## 2011-01-06 DIAGNOSIS — G459 Transient cerebral ischemic attack, unspecified: Secondary | ICD-10-CM

## 2011-01-06 LAB — LIPID PANEL
Cholesterol: 163 mg/dL (ref 0–200)
HDL: 49 mg/dL (ref >39)
LDL Cholesterol: 100 mg/dL — ABNORMAL HIGH (ref 0–99)
Total CHOL/HDL Ratio: 3.3 RATIO
Triglycerides: 71 mg/dL (ref <150)

## 2011-01-06 LAB — HEMOGLOBIN A1C: Hgb A1c MFr Bld: 6 % — ABNORMAL HIGH (ref <5.7)

## 2011-01-06 NOTE — H&P (Signed)
[name], [name]                 ACCOUNT NO.:  [id number]  MEDICAL RECORD NO.:  [id number]           PATIENT TYPE:  I  LOCATION:  1442                         FACILITY:  [facility]  PHYSICIAN:  Brendia Sacks, MD    DATE OF BIRTH:  [birth year]  DATE OF ADMISSION:  01/05/2011 DATE OF DISCHARGE:                             HISTORY & PHYSICAL   REFERRING PHYSICIAN:  Valerie A. [name], MD  PRIMARY CARE PHYSICIAN:  Raenette Rover. [name], MD  CHIEF COMPLAINT:  Numbness.  HISTORY OF PRESENT ILLNESS:  This is a 60 year old woman with a history of left upper and left lower extremity numbness.  She was seen by her primary care physician and referred for direct admission for further evaluation.  The patient two Saturdays ago developed cough and fever and went to see Dr. [name] last Wednesday, 1 week ago, and at that time was prescribed Zithromax and given a shot of steroids.  The following day 6 days ago, she developed a left leg numbness while she was driving home from work. She characterizes this numbness as beginning in her left arm and then radiating down into her left leg.  This persisted for hours.  The following day, she had numbness again in her left upper and left lower extremity having awoke with this.  She went to see her primary care physician again and was suspected this might have related to the Zithromax.  This was discontinued and the patient was placed amoxicillin.  She was set up for outpatient MRI.  However, the patient had no further symptoms and so I have requested her MRI be cancelled for the following day.  The patient had no numbness for a period of 4 days until this morning when she woke up again with left upper and left lower extremity numbness and called her primary care physician.  She was seen in the office and at that time was noted have a nonfocal neurologic exam to be clinically stable.  Dr. [name] contacted me and after reviewing the patient's clinical  presentation, she was directly admitted for further evaluation for numbness of the left upper and lower extremity, rule out TIA versus CVA.  The patient's numbness went away approximately 10:30 this morning. However, she did experience recurrent left upper and lower extremity numbness at Dr. Diamantina Monks office.  Dr. [name] recommended referral to [facility]; however, the patient did not wish to pursue this as she preferred hospitalization at [facility].  The patient denies any previous history of stroke.  REVIEW OF SYSTEMS:  Negative for fever, changes to vision, sore throat, rash, muscle aches, chest pain, shortness of breath, nausea, vomiting, abdominal pain, diarrhea, dysuria and bleeding.  She denies any focal weakness.  She denies any dysarthria or dysphasia.  No facial drooping.  PAST MEDICAL HISTORY: 1. Diet-controlled diabetes mellitus, hemoglobin A1c was 6.0 in     November 2011. 2. Denies history of stroke or myocardial infarction.  PAST SURGICAL HISTORY: 1. Bilateral cataract extraction. 2. Bilateral retinal surgery in 1991. 3. Tonsillectomy at age 60.  ALLERGIES:  NOVOCAINE 50 years ago, response unknown.  FAMILY HISTORY:  Father with lung cancer.  Mother with diabetes.  SOCIAL HISTORY:  Nonsmoker, nondrinker.  She lives by herself here in [place].  MEDICATIONS: 1. Calcium carbonate and vitamin D over-the-counter p.o. daily. 2. Vitamin D3 2000 units p.o. daily. 3. Multivitamin p.o. daily. 4. Probiotics over-the-counter p.o. daily. 5. Aspirin 325 mg p.o. daily which was just started approximately 1     week ago. 6. Amoxicillin 500 mg p.o. t.i.d. for bronchitis, started Jan 01, 2011.  PHYSICAL EXAMINATION:  GENERAL:  Well-developed, well-nourished woman in no acute distress. VITAL SIGNS:  Temperature is 98.1, pulse 80, respirations 18, blood pressure 126/83, sat 92% room air. CARDIOVASCULAR:  Regular rate and rhythm.  No murmurs, rub, or  gallop. RESPIRATORY:  Clear to auscultation bilaterally.  No wheezes, rales, rhonchi.  Normal respiratory effort.  No lower extremity edema. HEENT:  Pupils are equal, round, reactive to light.  Lid, irises, conjunctive appear unremarkable.  Hearing is grossly normal.  Lips, gums and tongue appear grossly unremarkable. NECK:  Supple.  No lymphadenopathy or masses.  No thyromegaly. ABDOMEN:  Soft, nontender, nondistended except for some mild left lower quadrant or suprapubic pain. SKIN:  Normal without rash or indurations.  Nontender to palpation. MUSCULOSKELETAL:  Tone and strength in the upper and lower extremities is 5/5 and symmetric.  There is no dysdiadochokinesis.  Digits and nails in the upper and lower extremities appear to be normal without cyanosis, clubbing, or edema. NEUROLOGIC:  Cranial nerves II through XII are intact.  Speech is fluent and clear.  Face is symmetric.  As stated the patient was directly admitted laboratory studies, imaging, and EKG are pending at this time.  ASSESSMENT AND PLAN:  A 60 year old woman who presents with paresthesias. 1. Left upper and left lower extremity paresthesias, recurrent,     concerning for a transient ischemic attack or acute stroke.  The     patient will be further evaluated for stroke.  Given her current     symptoms of Zithromax, this would not appear to be connected. 2. Recent bronchitis.  Continue amoxicillin. 3. Diet-controlled diabetes mellitus.  This appears to be stable. 4. Full code.  I discussed further evaluation per stroke workup with the patient and she was in agreement.     Brendia Sacks, MD     DG/MEDQ  D:  01/05/2011  T:  01/05/2011  Job:  [id number]  cc:   Vikki Ports A. [name], MD  Electronically Signed by Brendia Sacks  on 01/06/2011 07:29:51 AM

## 2011-01-07 LAB — ANA: Anti Nuclear Antibody(ANA): NEGATIVE

## 2011-01-07 LAB — C-REACTIVE PROTEIN: CRP: 0.3 mg/dL — ABNORMAL LOW (ref <0.6)

## 2011-01-08 ENCOUNTER — Other Ambulatory Visit: Payer: 59

## 2011-01-10 ENCOUNTER — Other Ambulatory Visit (HOSPITAL_COMMUNITY): Payer: Self-pay | Admitting: Interventional Radiology

## 2011-01-10 DIAGNOSIS — I729 Aneurysm of unspecified site: Secondary | ICD-10-CM

## 2011-01-14 ENCOUNTER — Ambulatory Visit (INDEPENDENT_AMBULATORY_CARE_PROVIDER_SITE_OTHER): Payer: 59 | Admitting: Internal Medicine

## 2011-01-14 ENCOUNTER — Ambulatory Visit (INDEPENDENT_AMBULATORY_CARE_PROVIDER_SITE_OTHER)
Admission: RE | Admit: 2011-01-14 | Discharge: 2011-01-14 | Disposition: A | Discharge status: Home / Self Care | Payer: 59 | Source: Ambulatory Visit | Attending: Internal Medicine | Admitting: Internal Medicine

## 2011-01-14 ENCOUNTER — Encounter: Payer: Self-pay | Admitting: Internal Medicine

## 2011-01-14 VITALS — BP 110/70 | HR 74 | Temp 97.9°F | Ht 66.0 in | Wt 230.1 lb

## 2011-01-14 DIAGNOSIS — R200 Anesthesia of skin: Secondary | ICD-10-CM

## 2011-01-14 DIAGNOSIS — R209 Unspecified disturbances of skin sensation: Secondary | ICD-10-CM

## 2011-01-14 DIAGNOSIS — E119 Type 2 diabetes mellitus without complications: Secondary | ICD-10-CM

## 2011-01-14 DIAGNOSIS — M542 Cervicalgia: Secondary | ICD-10-CM

## 2011-01-14 DIAGNOSIS — I729 Aneurysm of unspecified site: Secondary | ICD-10-CM

## 2011-01-14 NOTE — Patient Instructions (Addendum)
It was good to see you today. We have reviewed your prior records including labs and tests today Xray of neck ordered today. Your results will be called to you after review (48-72hours after test completion). If any changes need to be made, you will be notified at that time. Please schedule followup in 3-4 months, call sooner if problems.

## 2011-01-14 NOTE — Progress Notes (Signed)
Subjective:    Patient ID: [name], female    DOB: 22-Mar-1951, 60 y.o.   MRN: [id number]  HPI  Here for hosp follow up - eval for TIA due to left body numbness, recurrent Neuro eval for same -  Only one event since hosp dc -  ?about need for angio (incidental aneursym on MRA brain)   Past Medical History  Diagnosis Date  . Cataract   . Diverticulosis   . Diabetes mellitus     Review of Systems Constitutional: Negative for fever.  Respiratory: Negative for cough and shortness of breath.   Cardiovascular: Negative for chest pain.   Musculoskeletal: Negative for gait problem.  Neurological: Negative for dizziness.  No other specific complaints in a complete review of systems (except as listed in HPI above).     Objective:   Physical Exam  BP 110/70  Pulse 74  Temp(Src) 97.9 F (36.6 C) (Oral)  Ht 5\' 6"  (1.676 m)  Wt 230 lb 1.9 oz (104.382 kg)  BMI 37.14 kg/m2  SpO2 97%  Constitutional: She is oriented to person, place, and time. She appears well-developed and well-nourished. No distress.  Eyes: Conjunctivae and EOM are normal. Pupils are equal, round, and reactive to light. No scleral icterus.  Neck: Normal range of motion. Neck supple. No JVD present. No thyromegaly present. No carotid bruits bilaterally. Cardiovascular: Normal rate, regular rhythm and normal heart sounds.  No murmur heard. No BLE edema. Pulmonary/Chest: Effort normal and breath sounds normal. No respiratory distress. She has no wheezes.  Neurological: She is alert and oriented to person, place, and time. No cranial nerve deficit. Coordination normal. no parasthesia  Psychiatric: She has a normal mood and affect. Her behavior is normal. Judgment and thought content normal.     Lab Results  Component Value Date   WBC 9.1 01/05/2011   HGB 15.4* 01/05/2011   HCT 46.4* 01/05/2011   PLT 311 01/05/2011   CHOL 163 01/06/2011   TRIG 71 01/06/2011   HDL 49 01/06/2011   ALT 20 8/65/7846   AST 17 01/05/2011    NA 143 01/05/2011   K 4.2 01/05/2011   CL 106 01/05/2011   CREATININE 0.95 01/05/2011   BUN 21 01/05/2011   CO2 27 01/05/2011   TSH 2.97 10/28/2009   INR 1.00 01/05/2011   HGBA1C  Value: 6.0 (NOTE)                                                                       According to the ADA Clinical Practice Recommendations for 2011, when HbA1c is used as a screening test:   >=6.5%   Diagnostic of Diabetes Mellitus           (if abnormal result  is confirmed)  5.7-6.4%   Increased risk of developing Diabetes Mellitus  References:Diagnosis and Classification of Diabetes Mellitus,Diabetes Care,2011,34(Suppl 1):S62-S69 and Standards of Medical Care in         Diabetes - 2011,Diabetes Care,2011,34  (Suppl 1):S11-S61.* 01/05/2011      Assessment & Plan:  See problem list. Medications and labs reviewed today.  L facial numbness and LUE/LLE paraesthesia - intermittent symptoms ongoing since 12/31/10 - on ASA 325 qd - no evidence for TIA - reviewed  MRI/MRA brain, echo and carotids - consider eval of cervical source - check xray for DDD changes today -   Incidental aneuyrusm with plans for elective angio 02/2011 - reassurance provided  DM2 - diet controlled, no meds

## 2011-01-19 NOTE — Discharge Summary (Signed)
NAMEGLENISHA, [name]                 ACCOUNT NO.:  [id number]  MEDICAL RECORD NO.:  [id number]           PATIENT TYPE:  I  LOCATION:  1442                         FACILITY:  [facility]  PHYSICIAN:  Brendia Sacks, MD    DATE OF BIRTH:  [birth year]  DATE OF ADMISSION:  01/05/2011 DATE OF DISCHARGE:  01/06/2011                              DISCHARGE SUMMARY   PRIMARY CARE PHYSICIAN:  Vikki Ports A. [name], MD  CONDITION ON DISCHARGE:  Improved.  DISPOSITION:  Home.  DISCHARGE DIAGNOSES: 1. Left upper and left lower extremity paresthesias. 2. Acute bronchitis/sinusitis. 3. Bilateral paraophthalmic aneurysms, incidental. 4. Type 2 diabetes mellitus, stable.  HISTORY OF PRESENT ILLNESS:  A 60 year old woman who was referred by her primary care physician for a direct admission for left upper and left lower extremity numbness.  HOSPITAL COURSE: 1. Left upper and left lower extremity paresthesias.  The patient had     experienced several episodes of paresthesias in the outpatient     setting, so she was admitted to rule out CVA and TIA.  She did have     1 episode of transient recurrence of her symptoms, again left upper     and left lower extremity numbness.  She, however, had no focal     neurologic deficits.  Stroke evaluation was negative.  Incidental     finding of bilateral paraophthalmic aneurysms noted and further     discussed below.  I discussed these with Dr. Hoy Morn of Neurology,     who has reviewed the patient's films and feels like this is not a     TIA, most likely this is a radicular in nature, maybe related to     the cervical spine.  He has recommended no further inpatient     evaluation.  He does concur with aspirin usage in the outpatient     setting considering imaging of the cervical spine, which can be     safely deferred to the outpatient setting.  Consider followup with     [facility].  If symptoms do not improve, could     also consider  Neurontin 100 mg p.o. t.i.d..  At this point, the     patient wishes to minimize medications.  To complete the     evaluation, he is recommended an ESR, CRP,  vitamin B12 level, and     ANA.  These can be followed up in the outpatient setting. 2. Acute bronchitis/sinusitis.  This appears to be stable.  Continue     on amoxicillin as she has been prescribed in the outpatient     setting. 3. Bilateral paraophthalmic ICA aneurysms.  Again, discussed with Dr.     Hoy Morn, these were felt to be incidental in nature, possibly     congenital and certainly have no relationship to her current     symptoms.  As discussed, the case with Dr. Corliss Skains of     Neurointerventional Radiology.  He will follow up with the patient     in the outpatient setting for angiogram.  As these have been  present for sometime, she has no related symptoms, this can be     deferred to the outpatient setting over the next several weeks.     The patient will follow up with him in the outpatient setting. 4. Diet-controlled diabetes mellitus.  This appears to be stable.  CONSULTATIONS:  No formal consultation, but discussions with Neuroradiology, and Neurology as described above.  PROCEDURES:  None.  IMAGING: 1. CT of the head May 30, acute-on-chronic left sphenoid sinusitis. 2. No significant intracranial abnormality. 3. MRI of the brain May 31, no acute intracranial abnormality. 4. MRA of the brain May 31, small bilateral paraophthalmic ICA     aneurysms.  Negative intracranial MRA otherwise.  MICROBIOLOGY:  None.  ANCILLARY STUDIES: 1. 2-D echocardiogram performed today.  Results are pending. 2. Preliminary report on bilateral carotid Dopplers were negative for     ICA stenosis bilaterally. 3. EKG independently reviewed showed normal sinus rhythm with no acute     changes.  PERTINENT LABORATORY STUDIES: 1. ESR was within normal limits to lipid profile, total cholesterol     163, HDL 49, LDL 100. 2.  Hemoglobin A1c 6.0. 3. CBC was unremarkable. 4. Basic metabolic panel was essentially unremarkable. 5. Hepatic function panel unremarkable. 6. Cardiac enzymes negative. 7. Urinalysis essentially negative.  DISCHARGE INSTRUCTIONS:  The patient will be discharged home today.  She should follow up with Dr. [name] in about 2 weeks and follow up with Dr. Corliss Skains for angiogram as directed.  The patient will plan on doing this within the next 6 weeks.  She can follow up with [facility], if felt to be necessary or her symptoms worsened.  DIET:  Diabetic diet.  ACTIVITY:  As tolerated.  Note that the patient had never had any focal neurologic deficits, and therefore, PT and OT consultations were not obtained, as these were not felt to be necessary.  She had no difficulty swallowing and no dysarthria, again no speech therapy consultation was felt to be necessary.  DISCHARGE MEDICATIONS: 1. Amoxicillin 500 mg p.o. t.i.d. 2. Aspirin 325 mg p.o. daily. 3. Calcium carbonate and vitamin D over-the-counter p.o. daily. 4. Multivitamin p.o. daily. 5. Probiotics p.o. daily. 6. Vitamin D3 2000 units p.o. daily.  Discontinue the following medications, diazepam.  This is not chronic medication.  She was going to take this for MRI.  Things follow up in the outpatient setting.  See body of dictation above.  I did discuss discharge plan and apprised Dr. [name] of the above results.     Brendia Sacks, MD     DG/MEDQ  D:  01/06/2011  T:  01/07/2011  Job:  [id number]  cc:   Vikki Ports A. [name], MD [address] [place], [place] [address]  Electronically Signed by Brendia Sacks  on 01/19/2011 05:48:48 PM

## 2011-02-04 ENCOUNTER — Telehealth: Payer: Self-pay

## 2011-02-04 MED ORDER — AMOXICILLIN 500 MG PO CAPS: 500.0000 mg | ORAL_CAPSULE | Freq: Three times a day (TID) | ORAL | Status: AC

## 2011-02-04 NOTE — Telephone Encounter (Signed)
Pt called stating her cough has returned since OV 05/23. Pt is requesting refill of ABX, please advise, pt says she cannot come in for OV today.

## 2011-02-04 NOTE — Telephone Encounter (Signed)
amox x 7 days as before - erx done - needs OV if unimproved, sooner if worse

## 2011-02-04 NOTE — Telephone Encounter (Signed)
Pt advised of Rx/pharmacy and will make OV if no improvement

## 2011-02-18 ENCOUNTER — Other Ambulatory Visit (HOSPITAL_COMMUNITY): Payer: 59

## 2011-04-15 ENCOUNTER — Other Ambulatory Visit (INDEPENDENT_AMBULATORY_CARE_PROVIDER_SITE_OTHER): Payer: 59

## 2011-04-15 ENCOUNTER — Encounter: Payer: Self-pay | Admitting: Internal Medicine

## 2011-04-15 ENCOUNTER — Ambulatory Visit (INDEPENDENT_AMBULATORY_CARE_PROVIDER_SITE_OTHER): Payer: 59 | Admitting: Internal Medicine

## 2011-04-15 DIAGNOSIS — Z124 Encounter for screening for malignant neoplasm of cervix: Secondary | ICD-10-CM

## 2011-04-15 DIAGNOSIS — E119 Type 2 diabetes mellitus without complications: Secondary | ICD-10-CM

## 2011-04-15 DIAGNOSIS — R1032 Left lower quadrant pain: Secondary | ICD-10-CM

## 2011-04-15 DIAGNOSIS — Z1211 Encounter for screening for malignant neoplasm of colon: Secondary | ICD-10-CM

## 2011-04-15 LAB — HEMOGLOBIN A1C: Hgb A1c MFr Bld: 5.8 % (ref 4.6–6.5)

## 2011-04-15 NOTE — Progress Notes (Signed)
Subjective:    Patient ID: [name], female    DOB: [birth year], 60 y.o.   MRN: [id number]  HPI  Here for follow up - reviewed chronic medical issues  diet controlled DM2  dx by high fasting glc on CPX labs 10/2009-  +FH hx same -  +weight loss efforts and diet reviewed -   hosp 12/2010 for TIA due to recurrent left body numbness Neuro eval for same - declines IC angio   Past Medical History  Diagnosis Date  . Cataract   . Diverticulosis   . Diabetes mellitus     Review of Systems  Constitutional: Negative for fever.  Gastrointestinal:       Mild LLQ pain yesterday ?diverticular flare - but improved today  Musculoskeletal: Positive for joint swelling.  Constitutional: Negative for fever.  Respiratory: Negative for cough and shortness of breath.   Cardiovascular: Negative for chest pain.       Objective:   Physical Exam  BP 118/78  Pulse 91  Temp(Src) 98.3 F (36.8 C) (Oral)  Ht 5\' 6"  (1.676 m)  Wt 231 lb 12.8 oz (105.144 kg)  BMI 37.41 kg/m2  SpO2 95% Wt Readings from Last 3 Encounters:  04/15/11 231 lb 12.8 oz (105.144 kg)  01/14/11 230 lb 1.9 oz (104.382 kg)  12/29/10 233 lb (105.688 kg)   Constitutional: She is oriented to person, place, and time. She appears well-developed and well-nourished. No distress.  Eyes: Conjunctivae and EOM are normal. Pupils are equal, round, and reactive to light. No scleral icterus.  Neck: Normal range of motion. Neck supple. No JVD present. No thyromegaly present. No carotid bruits bilaterally. Cardiovascular: Normal rate, regular rhythm and normal heart sounds.  No murmur heard. No BLE edema. Pulmonary/Chest: Effort normal and breath sounds normal. No respiratory distress. She has no wheezes.  Abd - obese, S NTND, +BS, no R/G - no mass Neurological: She is alert and oriented to person, place, and time. No cranial nerve deficit. Coordination normal. no parasthesia  Psychiatric: She has a normal mood and affect. Her behavior  is normal. Judgment and thought content normal.     Lab Results  Component Value Date   WBC 9.1 01/05/2011   HGB 15.4* 01/05/2011   HCT 46.4* 01/05/2011   PLT 311 01/05/2011   CHOL 163 01/06/2011   TRIG 71 01/06/2011   HDL 49 01/06/2011   ALT 20 02/04/5283   AST 17 01/05/2011   NA 143 01/05/2011   K 4.2 01/05/2011   CL 106 01/05/2011   CREATININE 0.95 01/05/2011   BUN 21 01/05/2011   CO2 27 01/05/2011   TSH 2.97 10/28/2009   INR 1.00 01/05/2011   HGBA1C  Value: 6.0 (NOTE)                                                                       According to the ADA Clinical Practice Recommendations for 2011, when HbA1c is used as a screening test:   >=6.5%   Diagnostic of Diabetes Mellitus           (if abnormal result  is confirmed)  5.7-6.4%   Increased risk of developing Diabetes Mellitus  References:Diagnosis and Classification of Diabetes Mellitus,Diabetes Care,2011,34(Suppl 1):S62-S69 and Standards of  Medical Care in         Diabetes - 2011,Diabetes Care,2011,34  (Suppl 1):S11-S61.* 01/05/2011     Assessment & Plan:  See problem list. Medications and labs reviewed today.   Zostavax - will call when supply in - other HM needs reviewed - refer for colo, PAP/pelvic  LLQ pain yesterday - improved today (<24h) exam benign, no evidence for diverticular flare - hold empiric abx but call if worse, pt understands and agrees

## 2011-04-15 NOTE — Patient Instructions (Signed)
It was good to see you today. If you develop worsening stomach symptoms or fever, call and we can reconsider antibiotics, but it does not appear necessary to use antibiotics at this time. Test(s) ordered today. Your results will be called to you after review (48-72hours after test completion). If any changes need to be made, you will be notified at that time. we'll make referral to gynecology for PAP/pelvic and GI for colonoscopy screening . Our office will contact you regarding appointment(s) once made. Please schedule followup in 4 months for DM and weight check, call sooner if problems.

## 2011-04-15 NOTE — Assessment & Plan Note (Signed)
Diet controlled - no significant weight loss  Check a1c now  Lab Results  Component Value Date   HGBA1C  Value: 6.0 (NOTE)                                                                       According to the ADA Clinical Practice Recommendations for 2011, when HbA1c is used as a screening test:   >=6.5%   Diagnostic of Diabetes Mellitus           (if abnormal result  is confirmed)  5.7-6.4%   Increased risk of developing Diabetes Mellitus  References:Diagnosis and Classification of Diabetes Mellitus,Diabetes Care,2011,34(Suppl 1):S62-S69 and Standards of Medical Care in         Diabetes - 2011,Diabetes Care,2011,34  (Suppl 1):S11-S61.* 01/05/2011

## 2011-04-19 ENCOUNTER — Telehealth: Payer: Self-pay | Admitting: *Deleted

## 2011-04-19 NOTE — Telephone Encounter (Signed)
Called pt to let her know zostavax is in, but her insurance doesn't cover.If she still want will have to pay $248.00. No answer LMOM RTC concerning zostavax info.Marland KitchenMarland KitchenMarland Kitchen9/11/12@10 :55am/LMB

## 2011-04-19 NOTE — Telephone Encounter (Signed)
Pt return call abck gave her info concerning zostavax. Pt states she will wait on getting injection.Marland KitchenMarland Kitchen9/11/12@3 :29pm/LMB

## 2011-05-20 ENCOUNTER — Encounter: Payer: Self-pay | Admitting: Internal Medicine

## 2011-05-20 ENCOUNTER — Ambulatory Visit (AMBULATORY_SURGERY_CENTER): Payer: 59 | Admitting: *Deleted

## 2011-05-20 VITALS — Ht 68.0 in | Wt 236.0 lb

## 2011-05-20 DIAGNOSIS — Z1211 Encounter for screening for malignant neoplasm of colon: Secondary | ICD-10-CM

## 2011-05-20 MED ORDER — PEG-KCL-NACL-NASULF-NA ASC-C 100 G PO SOLR: ORAL | Status: DC

## 2011-06-03 ENCOUNTER — Ambulatory Visit (AMBULATORY_SURGERY_CENTER): Payer: 59 | Admitting: Internal Medicine

## 2011-06-03 ENCOUNTER — Encounter: Payer: Self-pay | Admitting: Internal Medicine

## 2011-06-03 VITALS — BP 135/65 | HR 63 | Temp 97.4°F | Resp 15 | Ht 68.0 in | Wt 236.0 lb

## 2011-06-03 DIAGNOSIS — D126 Benign neoplasm of colon, unspecified: Secondary | ICD-10-CM

## 2011-06-03 DIAGNOSIS — Z1211 Encounter for screening for malignant neoplasm of colon: Secondary | ICD-10-CM

## 2011-06-03 MED ORDER — SODIUM CHLORIDE 0.9 % IV SOLN: 500.0000 mL | INTRAVENOUS | Status: DC

## 2011-06-03 NOTE — Patient Instructions (Signed)
Please review discharge instructions (blue and green sheets)  Await pathology  No Aspirin, Aspirin containing products, or Nsaids for 2 weeks- use Tylenol if needed  Resume your normal medications

## 2011-06-06 ENCOUNTER — Telehealth: Payer: Self-pay | Admitting: *Deleted

## 2011-06-06 NOTE — Telephone Encounter (Signed)
No id on machine, no message left  

## 2011-06-12 ENCOUNTER — Encounter: Payer: Self-pay | Admitting: Internal Medicine

## 2011-07-28 ENCOUNTER — Ambulatory Visit (INDEPENDENT_AMBULATORY_CARE_PROVIDER_SITE_OTHER): Payer: 59 | Admitting: Internal Medicine

## 2011-07-28 ENCOUNTER — Encounter: Payer: Self-pay | Admitting: Internal Medicine

## 2011-07-28 VITALS — BP 110/82 | HR 104 | Temp 100.3°F

## 2011-07-28 DIAGNOSIS — J209 Acute bronchitis, unspecified: Secondary | ICD-10-CM

## 2011-07-28 DIAGNOSIS — E119 Type 2 diabetes mellitus without complications: Secondary | ICD-10-CM

## 2011-07-28 MED ORDER — DOXYCYCLINE HYCLATE 100 MG PO TABS: 100.0000 mg | ORAL_TABLET | Freq: Two times a day (BID) | ORAL | Status: AC

## 2011-07-28 MED ORDER — HYDROCOD POLST-CHLORPHEN POLST 10-8 MG/5ML PO LQCR: 5.0000 mL | Freq: Two times a day (BID) | ORAL | Status: DC | PRN

## 2011-07-28 NOTE — Progress Notes (Signed)
  Subjective:    Patient ID: [name], female    DOB: 05/21/1951, 60 y.o.   MRN: [id number]  HPI  complains of deep cough Onset 4 days ago ago, gradually progressive symptoms  associated with fever and scant but thick green sputum No shortness of breath or le swelling Not improved with OTC meds -  Past Medical History  Diagnosis Date  . Cataract   . Diverticulosis   . Diabetes mellitus     controlled by diet    Review of Systems  HENT: Positive for sore throat. Negative for rhinorrhea and postnasal drip.   Respiratory: Positive for cough and chest tightness. Negative for wheezing.   Cardiovascular: Negative for chest pain and palpitations.       Objective:   Physical Exam BP 110/82  Pulse 104  Temp(Src) 100.3 F (37.9 C) (Oral)  SpO2 96% Wt Readings from Last 3 Encounters:  06/03/11 236 lb (107.049 kg)  05/20/11 236 lb (107.049 kg)  04/15/11 231 lb 12.8 oz (105.144 kg)   GEN: mildly ill, congested cough HENT: nontender over sinus, OP red but without exudate, TMs clear Lung:B rhonchi L>R, no wheeze CV: RRR  Lab Results  Component Value Date   WBC 9.1 01/05/2011   HGB 15.4* 01/05/2011   HCT 46.4* 01/05/2011   PLT 311 01/05/2011   GLUCOSE 112* 01/05/2011   CHOL 163 01/06/2011   TRIG 71 01/06/2011   HDL 49 01/06/2011   LDLCALC  Value: 100       01/06/2011   ALT 20 01/05/2011   AST 17 01/05/2011   NA 143 01/05/2011   K 4.2 01/05/2011   CL 106 01/05/2011   CREATININE 0.95 01/05/2011   BUN 21 01/05/2011   CO2 27 01/05/2011   TSH 2.97 10/28/2009   INR 1.00 01/05/2011   HGBA1C 5.8 04/15/2011         Assessment & Plan:  Bronchitis, acute  May be viral but tx with empiric antibiotics given fever duration >72h and sputum Doxy bid and prescription cough syrup Alternate between aleve and tylenol for aches, pain and fever symptoms as discussed

## 2011-07-28 NOTE — Assessment & Plan Note (Signed)
Diet controlled - no significant weight changes  Lab Results  Component Value Date   HGBA1C 5.8 04/15/2011

## 2011-07-28 NOTE — Patient Instructions (Signed)
It was good to see you today. Doxycycline antibiotics 2x/day and Tussionex for cough as needed - Your prescription(s) have been submitted to your pharmacy. Please take as directed and contact our office if you believe you are having problem(s) with the medication(s). Alternate between Aleve and tylenol for aches, pain and fever symptoms as discussed

## 2011-08-19 ENCOUNTER — Telehealth: Payer: Self-pay | Admitting: *Deleted

## 2011-08-19 ENCOUNTER — Other Ambulatory Visit (INDEPENDENT_AMBULATORY_CARE_PROVIDER_SITE_OTHER): Payer: 59

## 2011-08-19 ENCOUNTER — Encounter: Payer: Self-pay | Admitting: Internal Medicine

## 2011-08-19 ENCOUNTER — Ambulatory Visit (INDEPENDENT_AMBULATORY_CARE_PROVIDER_SITE_OTHER): Payer: 59 | Admitting: Internal Medicine

## 2011-08-19 VITALS — BP 102/82 | HR 77 | Temp 98.2°F | Wt 231.0 lb

## 2011-08-19 DIAGNOSIS — E119 Type 2 diabetes mellitus without complications: Secondary | ICD-10-CM

## 2011-08-19 DIAGNOSIS — Z0000 Encounter for general adult medical examination without abnormal findings: Secondary | ICD-10-CM

## 2011-08-19 NOTE — Telephone Encounter (Signed)
Message copied by Deatra James on Fri Aug 19, 2011 10:06 AM ------      Message from: COUSIN, [place] T      Created: Fri Aug 19, 2011  9:16 AM      Regarding: PHY DATE:  02/17/12       THANKS

## 2011-08-19 NOTE — Assessment & Plan Note (Signed)
Diet controlled - reviewed weight changes Check a1c  Lab Results  Component Value Date   HGBA1C 5.8 04/15/2011

## 2011-08-19 NOTE — Progress Notes (Signed)
  Subjective:    Patient ID: [name], female    DOB: 1950/12/08, 61 y.o.   MRN: [id number]  HPI  Here for follow up - reviewed chronic medical issues  diet controlled DM2  dx by high fasting glc on CPX labs 10/2009-  +FH hx same -  +weight loss efforts and diet reviewed -   hosp 12/2010 for TIA due to recurrent left body numbness Neuro eval for same - declines IC angio   Past Medical History  Diagnosis Date  . Cataract   . Diverticulosis   . Diabetes mellitus     controlled by diet    Review of Systems  Constitutional: Negative for fever and fatigue.  Respiratory: Negative for cough and shortness of breath.   Cardiovascular: Negative for chest pain and palpitations.  Musculoskeletal: Negative for back pain and joint swelling.       Objective:   Physical Exam  BP 102/82  Pulse 77  Temp(Src) 98.2 F (36.8 C) (Oral)  Wt 231 lb (104.781 kg)  SpO2 95% Wt Readings from Last 3 Encounters:  08/19/11 231 lb (104.781 kg)  06/03/11 236 lb (107.049 kg)  05/20/11 236 lb (107.049 kg)   Constitutional: She appears well-developed and well-nourished. No distress.  Neck: Normal range of motion. Neck supple. No JVD present. No thyromegaly present. No carotid bruits bilaterally. Cardiovascular: Normal rate, regular rhythm and normal heart sounds.  No murmur heard. No BLE edema. Pulmonary/Chest: Effort normal and breath sounds normal. No respiratory distress. She has no wheezes.       Lab Results  Component Value Date   WBC 9.1 01/05/2011   HGB 15.4* 01/05/2011   HCT 46.4* 01/05/2011   PLT 311 01/05/2011   CHOL 163 01/06/2011   TRIG 71 01/06/2011   HDL 49 01/06/2011   ALT 20 09/21/863   AST 17 01/05/2011   NA 143 01/05/2011   K 4.2 01/05/2011   CL 106 01/05/2011   CREATININE 0.95 01/05/2011   BUN 21 01/05/2011   CO2 27 01/05/2011   TSH 2.97 10/28/2009   INR 1.00 01/05/2011   HGBA1C 5.8 04/15/2011     Assessment & Plan:  See problem list. Medications and labs reviewed  today.

## 2011-08-19 NOTE — Telephone Encounter (Signed)
Received staff msg pt made cpx for 02/17/12. Need labs entered in [facility].Marland KitchenMarland Kitchen1/11/13@10 ;07am/LMB

## 2011-08-19 NOTE — Patient Instructions (Signed)
It was good to see you today. Test(s) ordered today. Your results will be called to you after review (48-72hours after test completion). If any changes need to be made, you will be notified at that time. Please schedule followup in 6 months for physical/labs, DM and weight check; call sooner if problems.

## 2012-02-17 ENCOUNTER — Encounter: Payer: 59 | Admitting: Internal Medicine

## 2012-02-29 IMAGING — CR DG CERVICAL SPINE 2 OR 3 VIEWS
4 series · 4 of 4 positions shown · non-contrast
Comparison: None.

CLINICAL DATA: Neck pain

CERVICAL SPINE - 2-3 VIEW

[view not recorded (1 of 4)]
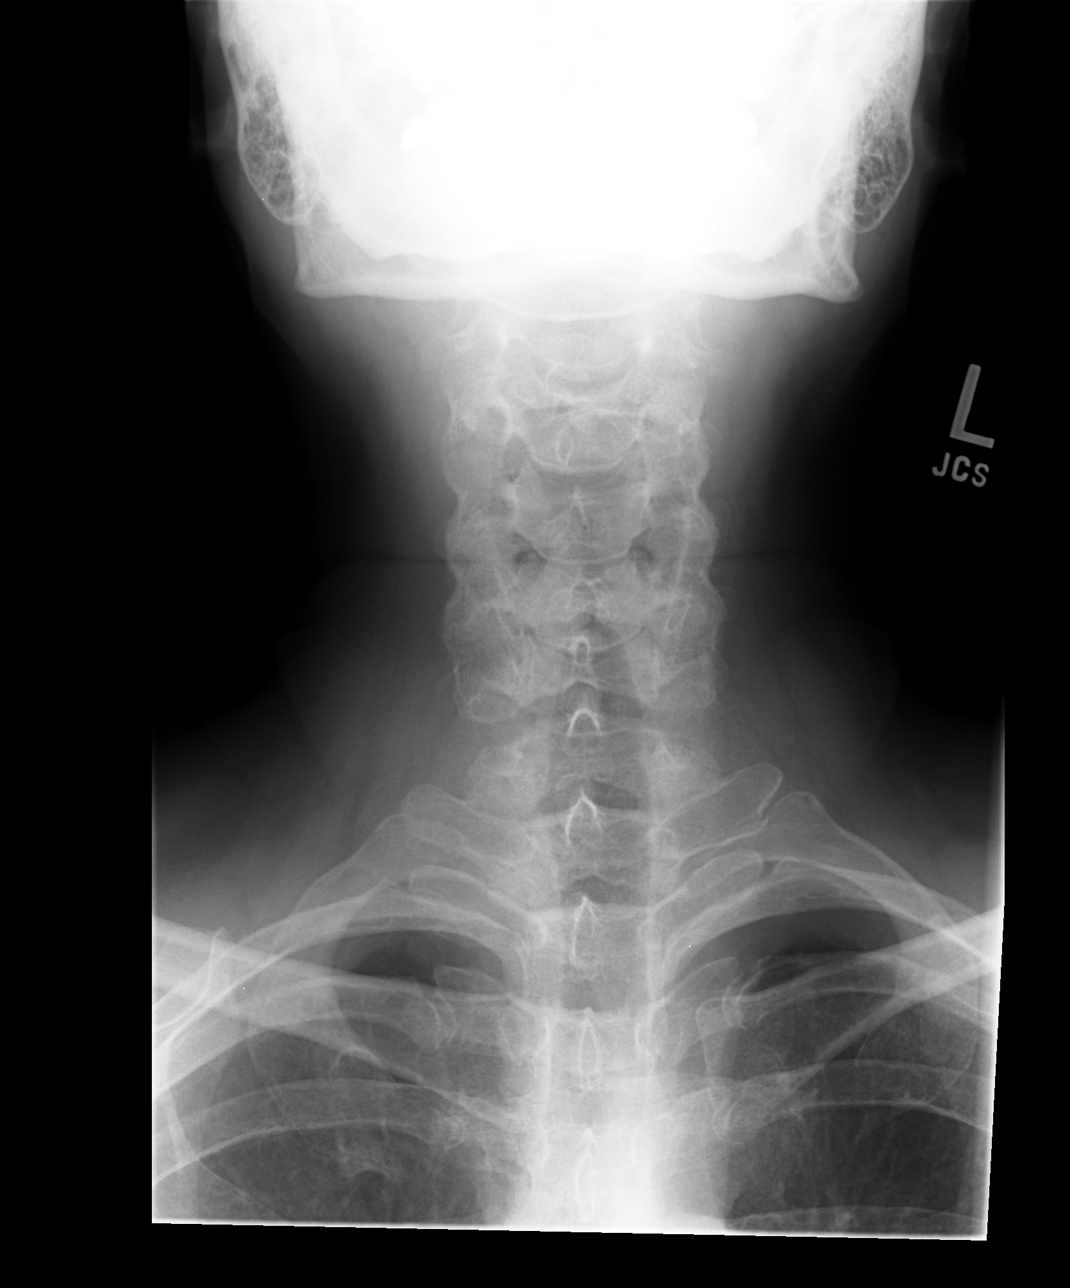

[view not recorded (2 of 4)]
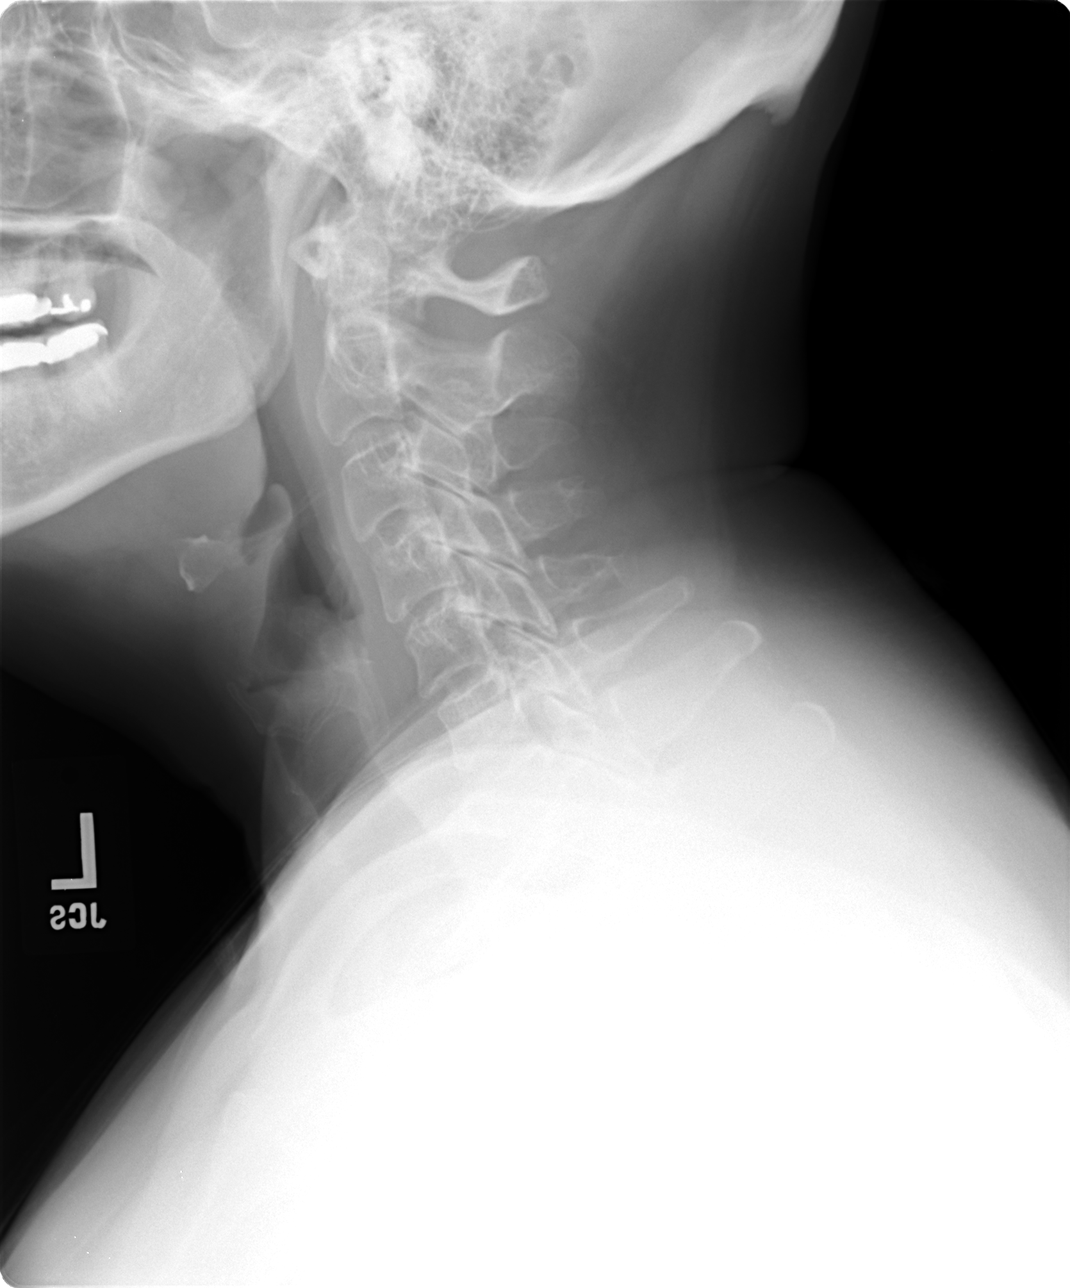

[view not recorded (3 of 4)]
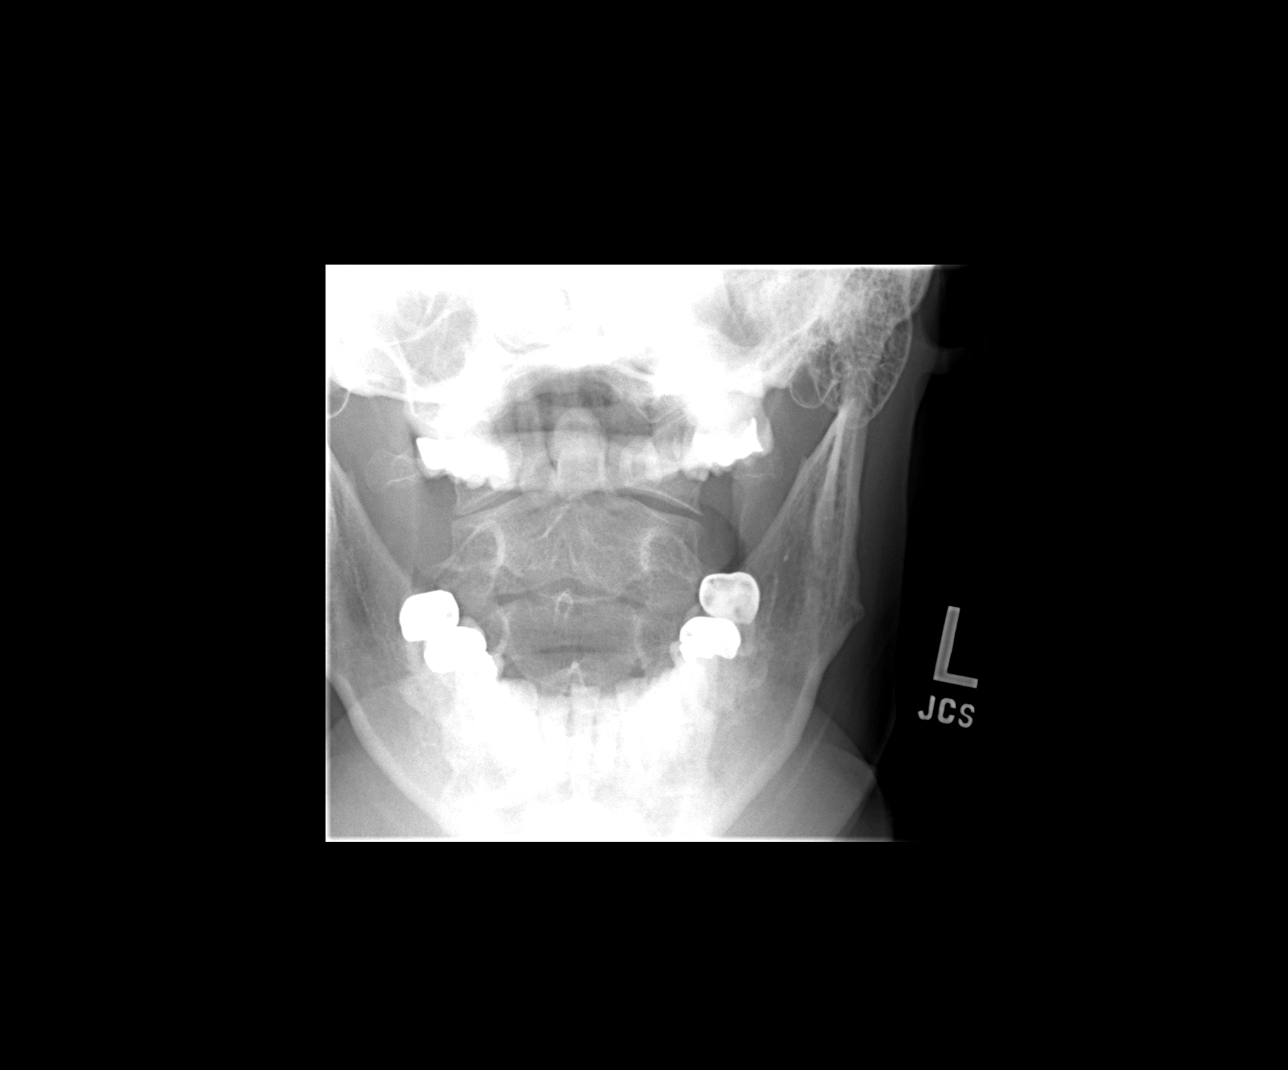

[view not recorded (4 of 4)]
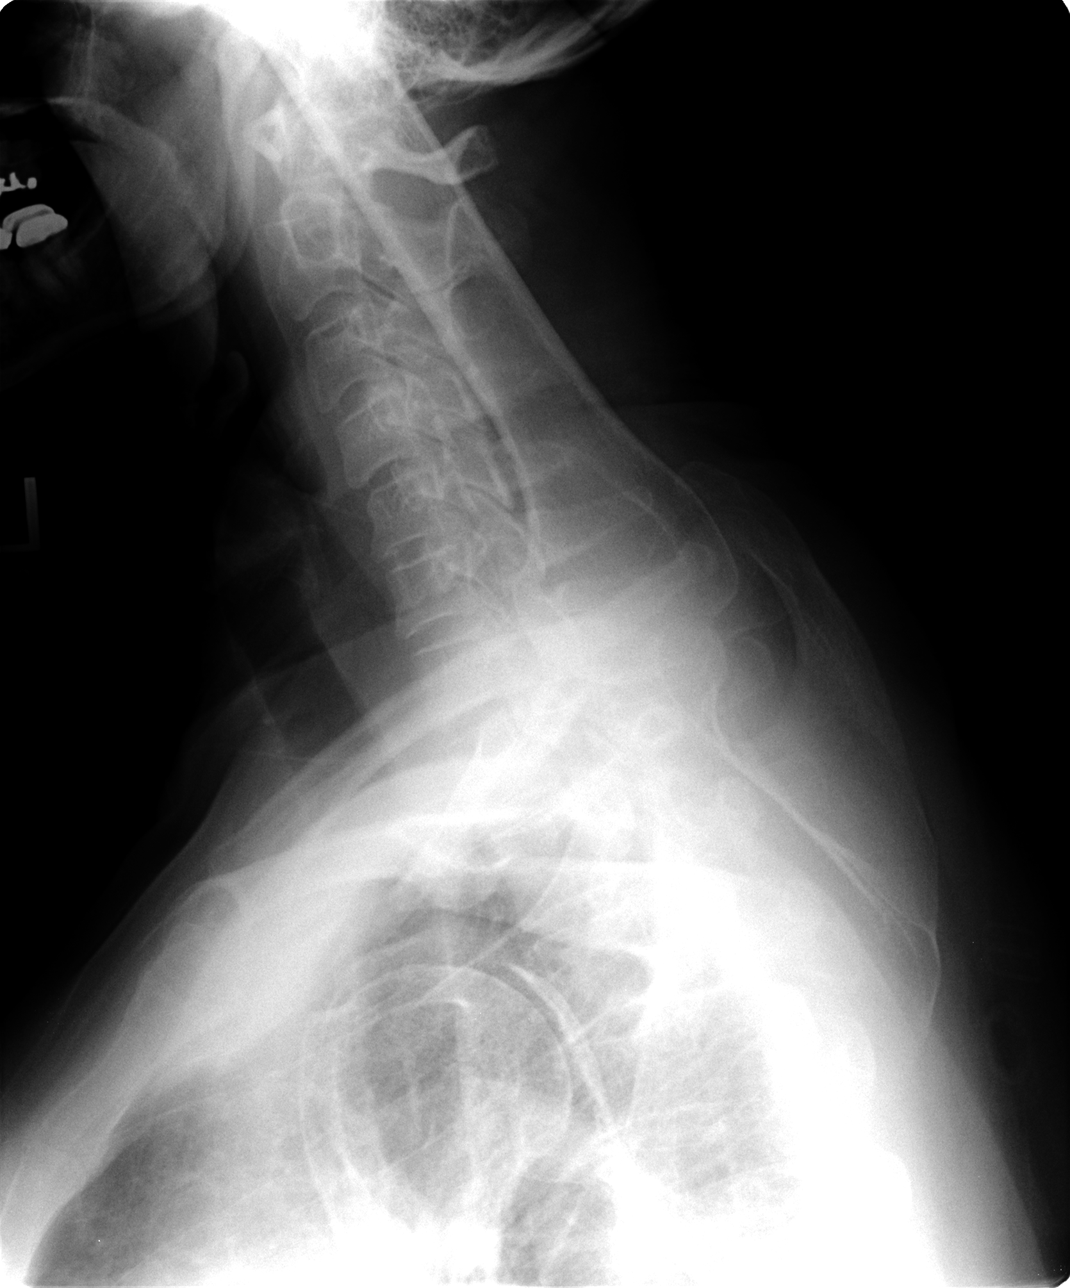

[4 of 4 positions shown; findings below may reference images not displayed]

FINDINGS: No subluxation or fractures.  There is mild disc
narrowing at C4-5 and C5-6 without significant osteophyte
formation.  Soft tissues unremarkable.
IMPRESSION: Mild degenerative disc disease - no acute findings.

## 2013-07-22 ENCOUNTER — Encounter: Payer: Self-pay | Admitting: Internal Medicine

## 2013-07-22 ENCOUNTER — Ambulatory Visit (INDEPENDENT_AMBULATORY_CARE_PROVIDER_SITE_OTHER): Payer: 59 | Admitting: Internal Medicine

## 2013-07-22 ENCOUNTER — Other Ambulatory Visit (INDEPENDENT_AMBULATORY_CARE_PROVIDER_SITE_OTHER): Payer: 59

## 2013-07-22 VITALS — BP 112/72 | HR 58 | Temp 98.1°F | Ht 66.0 in | Wt 222.2 lb

## 2013-07-22 DIAGNOSIS — E119 Type 2 diabetes mellitus without complications: Secondary | ICD-10-CM

## 2013-07-22 DIAGNOSIS — H6123 Impacted cerumen, bilateral: Secondary | ICD-10-CM

## 2013-07-22 DIAGNOSIS — R609 Edema, unspecified: Secondary | ICD-10-CM

## 2013-07-22 DIAGNOSIS — Z0000 Encounter for general adult medical examination without abnormal findings: Secondary | ICD-10-CM

## 2013-07-22 DIAGNOSIS — Z1239 Encounter for other screening for malignant neoplasm of breast: Secondary | ICD-10-CM

## 2013-07-22 DIAGNOSIS — E669 Obesity, unspecified: Secondary | ICD-10-CM | POA: Insufficient documentation

## 2013-07-22 DIAGNOSIS — H6120 Impacted cerumen, unspecified ear: Secondary | ICD-10-CM

## 2013-07-22 LAB — BASIC METABOLIC PANEL
BUN: 17 mg/dL (ref 6–23)
Chloride: 109 mEq/L (ref 96–112)
Creatinine, Ser: 1 mg/dL (ref 0.4–1.2)
GFR: 60.23 mL/min (ref >60.00)
Glucose, Bld: 98 mg/dL (ref 70–99)

## 2013-07-22 LAB — LIPID PANEL
HDL: 50.2 mg/dL (ref >39.00)
LDL Cholesterol: 100 mg/dL — ABNORMAL HIGH (ref 0–99)
Total CHOL/HDL Ratio: 3

## 2013-07-22 LAB — URINALYSIS, ROUTINE W REFLEX MICROSCOPIC
Bilirubin Urine: NEGATIVE
Hgb urine dipstick: NEGATIVE
Ketones, ur: NEGATIVE
Leukocytes, UA: NEGATIVE
Nitrite: NEGATIVE
Total Protein, Urine: NEGATIVE

## 2013-07-22 LAB — CBC WITH DIFFERENTIAL/PLATELET
Eosinophils Relative: 1.4 % (ref 0.0–5.0)
MCV: 95.6 fl (ref 78.0–100.0)
Monocytes Absolute: 0.4 10*3/uL (ref 0.1–1.0)
Monocytes Relative: 6.8 % (ref 3.0–12.0)
Neutrophils Relative %: 58 % (ref 43.0–77.0)
Platelets: 225 10*3/uL (ref 150.0–400.0)
WBC: 5.7 10*3/uL (ref 4.5–10.5)

## 2013-07-22 LAB — HEPATIC FUNCTION PANEL
AST: 19 U/L (ref 0–37)
Alkaline Phosphatase: 75 U/L (ref 39–117)
Bilirubin, Direct: 0.1 mg/dL (ref 0.0–0.3)
Total Bilirubin: 0.7 mg/dL (ref 0.3–1.2)

## 2013-07-22 LAB — HEMOGLOBIN A1C: Hgb A1c MFr Bld: 5.8 % (ref 4.6–6.5)

## 2013-07-22 LAB — MICROALBUMIN / CREATININE URINE RATIO
Microalb Creat Ratio: 0.3 mg/g (ref 0.0–30.0)
Microalb, Ur: 0.4 mg/dL (ref 0.0–1.9)

## 2013-07-22 NOTE — Assessment & Plan Note (Signed)
Wt Readings from Last 3 Encounters:  07/22/13 222 lb 3.2 oz (100.789 kg)  08/19/11 231 lb (104.781 kg)  06/03/11 236 lb (107.049 kg)   The patient is asked to make an attempt to improve diet and exercise patterns to aid in medical management of this problem.

## 2013-07-22 NOTE — Patient Instructions (Addendum)
It was good to see you today.  We have reviewed your prior records including labs and tests today  Health Maintenance reviewed - all recommended immunizations and age-appropriate screenings are up-to-date. Think about pneumonia and Shingles vaccinations as discussed   we'll make referral for mammogram . Our office will contact you regarding appointment(s) once made.  Test(s) ordered today. Your results will be released to MyChart (or called to you) after review, usually within 72hours after test completion. If any changes need to be made, you will be notified at that same time.  Medications reviewed and updated, no changes recommended at this time.  Your ears have been irrigated of wax today -let us know if continued hearing problems persist for referral to audiologist and hearing testing  Continue to work on lifestyle changes as discussed (low fat, low carb, increased protein diet; improved exercise efforts; weight loss) to control sugar, blood pressure and cholesterol levels and/or reduce risk of developing other medical problems. Look into [email or web address] or other type of food journal to assist you in this process.  Please schedule followup in 12 months for annual review and labs, call sooner if problems.  Health Maintenance, Female A healthy lifestyle and preventative care can promote health and wellness.  Maintain regular health, dental, and eye exams.  Eat a healthy diet. Foods like vegetables, fruits, whole grains, low-fat dairy products, and lean protein foods contain the nutrients you need without too many calories. Decrease your intake of foods high in solid fats, added sugars, and salt. Get information about a proper diet from your caregiver, if necessary.  Regular physical exercise is one of the most important things you can do for your health. Most adults should get at least 150 minutes of moderate-intensity exercise (any activity that increases your heart rate and causes you  to sweat) each week. In addition, most adults need muscle-strengthening exercises on 2 or more days a week.   Maintain a healthy weight. The body mass index (BMI) is a screening tool to identify possible weight problems. It provides an estimate of body fat based on height and weight. Your caregiver can help determine your BMI, and can help you achieve or maintain a healthy weight. For adults 20 years and older:  A BMI below 18.5 is considered underweight.  A BMI of 18.5 to 24.9 is normal.  A BMI of 25 to 29.9 is considered overweight.  A BMI of 30 and above is considered obese.  Maintain normal blood lipids and cholesterol by exercising and minimizing your intake of saturated fat. Eat a balanced diet with plenty of fruits and vegetables. Blood tests for lipids and cholesterol should begin at age 59 and be repeated every 5 years. If your lipid or cholesterol levels are high, you are over 50, or you are a high risk for heart disease, you may need your cholesterol levels checked more frequently.Ongoing high lipid and cholesterol levels should be treated with medicines if diet and exercise are not effective.  If you smoke, find out from your caregiver how to quit. If you do not use tobacco, do not start.  Lung cancer screening is recommended for adults aged 70 80 years who are at high risk for developing lung cancer because of a history of smoking. Yearly low-dose computed tomography (CT) is recommended for people who have at least a 30-pack-year history of smoking and are a current smoker or have quit within the past 15 years. A pack year of smoking is smoking an average of  1 pack of cigarettes a day for 1 year (for example: 1 pack a day for 30 years or 2 packs a day for 15 years). Yearly screening should continue until the smoker has stopped smoking for at least 15 years. Yearly screening should also be stopped for people who develop a health problem that would prevent them from having lung cancer  treatment.  If you are pregnant, do not drink alcohol. If you are breastfeeding, be very cautious about drinking alcohol. If you are not pregnant and choose to drink alcohol, do not exceed 1 drink per day. One drink is considered to be 12 ounces (355 mL) of beer, 5 ounces (148 mL) of wine, or 1.5 ounces (44 mL) of liquor.  Avoid use of street drugs. Do not share needles with anyone. Ask for help if you need support or instructions about stopping the use of drugs.  High blood pressure causes heart disease and increases the risk of stroke. Blood pressure should be checked at least every 1 to 2 years. Ongoing high blood pressure should be treated with medicines, if weight loss and exercise are not effective.  If you are 4 to 62 years old, ask your caregiver if you should take aspirin to prevent strokes.  Diabetes screening involves taking a blood sample to check your fasting blood sugar level. This should be done once every 3 years, after age 29, if you are within normal weight and without risk factors for diabetes. Testing should be considered at a younger age or be carried out more frequently if you are overweight and have at least 1 risk factor for diabetes.  Breast cancer screening is essential preventative care for women. You should practice "breast self-awareness." This means understanding the normal appearance and feel of your breasts and may include breast self-examination. Any changes detected, no matter how small, should be reported to a caregiver. Women in their 86s and 30s should have a clinical breast exam (CBE) by a caregiver as part of a regular health exam every 1 to 3 years. After age 63, women should have a CBE every year. Starting at age 33, women should consider having a mammogram (breast X-ray) every year. Women who have a family history of breast cancer should talk to their caregiver about genetic screening. Women at a high risk of breast cancer should talk to their caregiver about  having an MRI and a mammogram every year.  Breast cancer gene (BRCA)-related cancer risk assessment is recommended for women who have family members with BRCA-related cancers. BRCA-related cancers include breast, ovarian, tubal, and peritoneal cancers. Having family members with these cancers may be associated with an increased risk for harmful changes (mutations) in the breast cancer genes BRCA1 and BRCA2. Results of the assessment will determine the need for genetic counseling and BRCA1 and BRCA2 testing.  The Pap test is a screening test for cervical cancer. Women should have a Pap test starting at age 21. Between ages 87 and 58, Pap tests should be repeated every 2 years. Beginning at age 55, you should have a Pap test every 3 years as long as the past 3 Pap tests have been normal. If you had a hysterectomy for a problem that was not cancer or a condition that could lead to cancer, then you no longer need Pap tests. If you are between ages 69 and 63, and you have had normal Pap tests going back 10 years, you no longer need Pap tests. If you have had past treatment  for cervical cancer or a condition that could lead to cancer, you need Pap tests and screening for cancer for at least 20 years after your treatment. If Pap tests have been discontinued, risk factors (such as a new sexual partner) need to be reassessed to determine if screening should be resumed. Some women have medical problems that increase the chance of getting cervical cancer. In these cases, your caregiver may recommend more frequent screening and Pap tests.  The human papillomavirus (HPV) test is an additional test that may be used for cervical cancer screening. The HPV test looks for the virus that can cause the cell changes on the cervix. The cells collected during the Pap test can be tested for HPV. The HPV test could be used to screen women aged 56 years and older, and should be used in women of any age who have unclear Pap test  results. After the age of 49, women should have HPV testing at the same frequency as a Pap test.  Colorectal cancer can be detected and often prevented. Most routine colorectal cancer screening begins at the age of 62 and continues through age 41. However, your caregiver may recommend screening at an earlier age if you have risk factors for colon cancer. On a yearly basis, your caregiver may provide home test kits to check for hidden blood in the stool. Use of a small camera at the end of a tube, to directly examine the colon (sigmoidoscopy or colonoscopy), can detect the earliest forms of colorectal cancer. Talk to your caregiver about this at age 77, when routine screening begins. Direct examination of the colon should be repeated every 5 to 10 years through age 105, unless early forms of pre-cancerous polyps or small growths are found.  Hepatitis C blood testing is recommended for all people born from [birth year] through 1965 and any individual with known risks for hepatitis C.  Practice safe sex. Use condoms and avoid high-risk sexual practices to reduce the spread of sexually transmitted infections (STIs). Sexually active women aged 17 and younger should be checked for Chlamydia, which is a common sexually transmitted infection. Older women with new or multiple partners should also be tested for Chlamydia. Testing for other STIs is recommended if you are sexually active and at increased risk.  Osteoporosis is a disease in which the bones lose minerals and strength with aging. This can result in serious bone fractures. The risk of osteoporosis can be identified using a bone density scan. Women ages 40 and over and women at risk for fractures or osteoporosis should discuss screening with their caregivers. Ask your caregiver whether you should be taking a calcium supplement or vitamin D to reduce the rate of osteoporosis.  Menopause can be associated with physical symptoms and risks. Hormone replacement therapy  is available to decrease symptoms and risks. You should talk to your caregiver about whether hormone replacement therapy is right for you.  Use sunscreen. Apply sunscreen liberally and repeatedly throughout the day. You should seek shade when your shadow is shorter than you. Protect yourself by wearing long sleeves, pants, a wide-brimmed hat, and sunglasses year round, whenever you are outdoors.  Notify your caregiver of new moles or changes in moles, especially if there is a change in shape or color. Also notify your caregiver if a mole is larger than the size of a pencil eraser.  Stay current with your immunizations. Document Released: 02/07/2011 Document Revised: 11/19/2012 Document Reviewed: 02/07/2011 [facility] Patient Information 2014 [place], [place]. Diabetes  and Standards of Medical Care  Diabetes is complicated. You may find that your diabetes team includes a dietitian, nurse, diabetes educator, eye doctor, and more. To help everyone know what is going on and to help you get the care you deserve, the following schedule of care was developed to help keep you on track. Below are the tests, exams, vaccines, medicines, education, and plans you will need. HbA1c test This test shows how well you have controlled your glucose over the past 2 3 months. It is used to see if your diabetes management plan needs to be adjusted.   It is performed at least 2 times a year if you are meeting treatment goals.  It is performed 4 times a year if therapy has changed or if you are not meeting treatment goals. Blood pressure test  This test is performed at every routine medical visit. The goal is less than 140/90 mmHg for most people, but 130/80 mmHg in some cases. Ask your health care provider about your goal. Dental exam  Follow up with the dentist regularly. Eye exam  If you are diagnosed with type 1 diabetes as a child, get an exam upon reaching the age of 10 years or older and have had diabetes for 3  5 years. Yearly eye exams are recommended after that initial eye exam.  If you are diagnosed with type 1 diabetes as an adult, get an exam within 5 years of diagnosis and then yearly.  If you are diagnosed with type 2 diabetes, get an exam as soon as possible after the diagnosis and then yearly. Foot care exam  Visual foot exams are performed at every routine medical visit. The exams check for cuts, injuries, or other problems with the feet.  A comprehensive foot exam should be done yearly. This includes visual inspection as well as assessing foot pulses and testing for loss of sensation.  Check your feet nightly for cuts, injuries, or other problems with your feet. Tell your health care provider if anything is not healing. Kidney function test (urine microalbumin)  This test is performed once a year.  Type 1 diabetes: The first test is performed 5 years after diagnosis.  Type 2 diabetes: The first test is performed at the time of diagnosis.  A serum creatinine and estimated glomerular filtration rate (eGFR) test is done once a year to assess the level of chronic kidney disease (CKD), if present. Lipid profile (cholesterol, HDL, LDL, triglycerides)  Performed every 5 years for most people.  The goal for LDL is less than 100 mg/dL. If you are at high risk, the goal is less than 70 mg/dL.  The goal for HDL is 40 mg/dL 50 mg/dL for men and 50 mg/dL 60 mg/dL for women. An HDL cholesterol of 60 mg/dL or higher gives some protection against heart disease.  The goal for triglycerides is less than 150 mg/dL. Influenza vaccine, pneumococcal vaccine, and hepatitis B vaccine  The influenza vaccine is recommended yearly.  The pneumococcal vaccine is generally given once in a lifetime. However, there are some instances when another vaccination is recommended. Check with your health care provider.  The hepatitis B vaccine is also recommended for adults with diabetes. Diabetes self-management  education  Education is recommended at diagnosis and ongoing as needed. Treatment plan  Your treatment plan is reviewed at every medical visit. Document Released: 05/22/2009 Document Revised: 03/27/2013 Document Reviewed: 12/25/2012 [facility] Patient Information 2014 [place], [place].

## 2013-07-22 NOTE — Progress Notes (Signed)
Subjective:    Patient ID: [name], female    DOB: 1951-03-12, 62 y.o.   MRN: [id number]  HPI  patient is here today for annual physical. Patient feels well and has no complaints.  Also reviewed chronic medical issues interval medical events  Past Medical History  Diagnosis Date  . Cataract   . Diverticulosis   . Diabetes mellitus     controlled by diet   Family History  Problem Relation Age of Onset  . Arthritis Mother     Osteoarthritis  . Diabetes Mother     Type 2  . Cancer Father     Lung  . Colon cancer Neg Hx   . Stomach cancer Neg Hx   . Rectal cancer Neg Hx    History  Substance Use Topics  . Smoking status: Never Smoker   . Smokeless tobacco: Not on file     Comment: Single-lives alone, 2 dogs  . Alcohol Use: No    Review of Systems  Constitutional: Negative for fatigue and unexpected weight change.  HENT: Positive for hearing loss (mild, bilateral).   Respiratory: Negative for cough, shortness of breath and wheezing.   Cardiovascular: Negative for chest pain, palpitations and leg swelling (chronic, dep - unchanged).  Gastrointestinal: Negative for nausea, abdominal pain and diarrhea.  Neurological: Negative for dizziness, weakness, light-headedness and headaches.  Psychiatric/Behavioral: Negative for dysphoric mood. The patient is not nervous/anxious.   All other systems reviewed and are negative.       Objective:   Physical Exam BP 112/72  Pulse 58  Temp(Src) 98.1 F (36.7 C) (Oral)  Ht 5\' 6"  (1.676 m)  Wt 222 lb 3.2 oz (100.789 kg)  BMI 35.88 kg/m2  SpO2 95% Wt Readings from Last 3 Encounters:  07/22/13 222 lb 3.2 oz (100.789 kg)  08/19/11 231 lb (104.781 kg)  06/03/11 236 lb (107.049 kg)   Constitutional: She is overweight, but appears well-developed and well-nourished. No distress.  HENT: Head: Normocephalic and atraumatic. Ears: initially, bilateral cerumen obscuring TMs - after irrigation, B TMs ok, no erythema or effusion;  Nose: Nose normal. Mouth/Throat: Oropharynx is clear and moist. No oropharyngeal exudate.  Eyes: Conjunctivae and EOM are normal. Pupils are equal, round, and reactive to light. No scleral icterus.  Neck: Normal range of motion. Neck supple. No JVD present. No thyromegaly present.  Cardiovascular: Normal rate, regular rhythm and normal heart sounds.  No murmur heard. chronic bilaterally from the ankles with mild BLE edema. Pulmonary/Chest: Effort normal and breath sounds normal. No respiratory distress. She has no wheezes.  Abdominal: Soft. Bowel sounds are normal. She exhibits no distension. There is no tenderness. no masses Musculoskeletal: Normal range of motion, no joint effusions. No gross deformities Neurological: She is alert and oriented to person, place, and time. No cranial nerve deficit. Coordination, balance, strength, speech and gait are normal.  Skin: AKs over right shoulder blade x2. Remaining Skin is warm and dry. No rash noted. No erythema.  Psychiatric: She has a normal mood and affect. Her behavior is normal. Judgment and thought content normal.   Lab Results  Component Value Date   WBC 9.1 01/05/2011   HGB 15.4* 01/05/2011   HCT 46.4* 01/05/2011   PLT 311 01/05/2011   GLUCOSE 112* 01/05/2011   CHOL 163 01/06/2011   TRIG 71 01/06/2011   HDL 49 01/06/2011   LDLCALC  Value: 100        Total Cholesterol/HDL:CHD Risk Coronary Heart Disease Risk Table  Men   Women  1/2 Average Risk   3.4   3.3  Average Risk       5.0   4.4  2 X Average Risk   9.6   7.1  3 X Average Risk  23.4   11.0        Use the calculated Patient Ratio above and the CHD Risk Table to determine the patient's CHD Risk.        ATP III CLASSIFICATION (LDL):  <100     mg/dL   Optimal  161-096  mg/dL   Near or Above                    Optimal  130-159  mg/dL   Borderline  045-409  mg/dL   High  >811     mg/dL   Very High* 04/21/7828   ALT 20 01/05/2011   AST 17 01/05/2011   NA 143 01/05/2011   K 4.2  01/05/2011   CL 106 01/05/2011   CREATININE 0.95 01/05/2011   BUN 21 01/05/2011   CO2 27 01/05/2011   TSH 2.97 10/28/2009   INR 1.00 01/05/2011   HGBA1C 6.0 08/19/2011   ECG: sinus bradycardia 58 beats per minute. No ischemic change or arrhythmia   Procedure: wax removal, bilateral Reason: wax impaction, bilateral Risks and benefits of procedure discussed with the patient who agrees to proceed. Ear(s) irrigated with warm water. Large amount of wax removed. Instrumentation with metal ear loop was performed to accomplish wax removal. the patient tolerated procedure well.     Assessment & Plan:   CPX/v70.0 - Patient has been counseled on age-appropriate routine health concerns for screening and prevention. These are reviewed and up-to-date. Immunizations are up-to-date or declined. Labs and ECG reviewed.  Also See problem list. Medications and labs reviewed today.

## 2013-07-22 NOTE — Assessment & Plan Note (Signed)
Diet controlled - reviewed weight changes Check a1c and consider tx as needed  Lab Results  Component Value Date   HGBA1C 6.0 08/19/2011

## 2013-07-22 NOTE — Progress Notes (Signed)
Pre-visit discussion using our clinic review tool. No additional management support is needed unless otherwise documented below in the visit note.  

## 2014-04-18 ENCOUNTER — Encounter: Payer: Self-pay | Admitting: Internal Medicine

## 2014-05-23 ENCOUNTER — Other Ambulatory Visit: Payer: Self-pay

## 2014-06-06 ENCOUNTER — Encounter: Payer: Self-pay | Admitting: Internal Medicine

## 2014-06-09 ENCOUNTER — Encounter: Payer: Self-pay | Admitting: Internal Medicine

## 2014-07-15 ENCOUNTER — Ambulatory Visit (AMBULATORY_SURGERY_CENTER): Payer: Self-pay

## 2014-07-15 VITALS — Ht 68.5 in | Wt 249.6 lb

## 2014-07-15 DIAGNOSIS — Z86010 Personal history of colonic polyps: Secondary | ICD-10-CM

## 2014-07-15 MED ORDER — MOVIPREP 100 G PO SOLR: ORAL | Status: DC

## 2014-07-15 NOTE — Progress Notes (Signed)
Per pt, no allergies to soy or egg products.Pt not taking any weight loss meds or using  O2 at home. 

## 2014-07-23 ENCOUNTER — Encounter: Payer: 59 | Admitting: Internal Medicine

## 2014-08-13 ENCOUNTER — Encounter: Payer: Self-pay | Admitting: Internal Medicine

## 2014-08-13 ENCOUNTER — Ambulatory Visit (AMBULATORY_SURGERY_CENTER): Payer: 59 | Admitting: Internal Medicine

## 2014-08-13 VITALS — BP 120/71 | HR 57 | Temp 98.5°F | Resp 17 | Ht 68.0 in | Wt 249.0 lb

## 2014-08-13 DIAGNOSIS — D125 Benign neoplasm of sigmoid colon: Secondary | ICD-10-CM

## 2014-08-13 DIAGNOSIS — D120 Benign neoplasm of cecum: Secondary | ICD-10-CM

## 2014-08-13 DIAGNOSIS — D122 Benign neoplasm of ascending colon: Secondary | ICD-10-CM

## 2014-08-13 DIAGNOSIS — Z86010 Personal history of colonic polyps: Secondary | ICD-10-CM

## 2014-08-13 DIAGNOSIS — K635 Polyp of colon: Secondary | ICD-10-CM

## 2014-08-13 MED ORDER — SODIUM CHLORIDE 0.9 % IV SOLN: 500.0000 mL | INTRAVENOUS | Status: DC

## 2014-08-13 NOTE — Op Note (Signed)
[facility]  [address]. [place], [id number]   COLONOSCOPY PROCEDURE REPORT  PATIENT: Tonya, Garner  MR#: [id number] BIRTHDATE: December 04, 1950 , 63  yrs. old GENDER: female ENDOSCOPIST: Jerene Bears, MD PROCEDURE DATE:  08/13/2014 PROCEDURE:   Colonoscopy with cold biopsy polypectomy and Colonoscopy with snare polypectomy First Screening Colonoscopy - Avg.  risk and is 50 yrs.  old or older - No.  Prior Negative Screening - Now for repeat screening. N/A  History of Adenoma - Now for follow-up colonoscopy & has been > or = to 3 yrs.  Yes hx of adenoma.  Has been 3 or more years since last colonoscopy.  Polyps Removed Today? Yes. ASA CLASS:   Class II INDICATIONS:surveillance colonoscopy based on history of adenomatous colonic polyps and last colonoscopy completed 3 years ago. MEDICATIONS: Monitored anesthesia care and Propofol 250 mg IV  DESCRIPTION OF PROCEDURE:   After the risks benefits and alternatives of the procedure were thoroughly explained, informed consent was obtained.  The digital rectal exam revealed no rectal mass.   The LB [id number]  endoscope was introduced through the anus and advanced to the cecum, which was identified by both the appendix and ileocecal valve. No adverse events experienced. The quality of the prep was good, using MoviPrep  The instrument was then slowly withdrawn as the colon was fully examined.   COLON FINDINGS: Three sessile polyps ranging from 3 to 6 mm in size were found in the ascending colon, sigmoid colon, and at the cecum. Polypectomies were performed with a cold snare and with cold forceps.  The resection was complete, the polyp tissue was completely retrieved and sent to histology. No polyp tissue seen at previous cecal polypectomy site. There was mild diverticulosis noted in the descending colon, transverse colon, and ascending colon.  Retroflexed views revealed internal hemorrhoids. The time to cecum=2 minutes  49 seconds.  Withdrawal time=13 minutes 05 seconds.  The scope was withdrawn and the procedure completed. COMPLICATIONS: There were no immediate complications.  ENDOSCOPIC IMPRESSION: 1.   Three sessile polyps ranging from 3 to 6mm in size were found in the ascending colon, sigmoid colon, and at the cecum; polypectomies were performed with a cold snare and with cold forceps 2.   Mild diverticulosis was noted in the descending colon, transverse colon, and ascending colon  RECOMMENDATIONS: 1.  Await pathology results 2.  High fiber diet 3.  Timing of repeat colonoscopy will be determined by pathology findings. 4.  You will receive a letter within 1-2 weeks with the results of your biopsy as well as final recommendations.  Please call my office if you have not received a letter after 3 weeks.  eSigned:  Jerene Bears, MD 08/13/2014 10:07 AM  cc: The Patient and Rowe Clack, MD

## 2014-08-13 NOTE — Progress Notes (Signed)
A/ox3, pleased with MAC, report to RN 

## 2014-08-13 NOTE — Progress Notes (Signed)
Called to room to assist during endoscopic procedure.  Patient ID and intended procedure confirmed with present staff. Received instructions for my participation in the procedure from the performing physician.  

## 2014-08-13 NOTE — Patient Instructions (Signed)

## 2014-08-14 ENCOUNTER — Telehealth: Payer: Self-pay | Admitting: *Deleted

## 2014-08-14 NOTE — Telephone Encounter (Signed)
  Follow up Call-  Call back number 08/13/2014  Post procedure Call Back phone  # [phone]  Permission to leave phone message Yes     Patient questions:  Do you have a fever, pain , or abdominal swelling? No. Pain Score  0 *  Have you tolerated food without any problems? Yes.    Have you been able to return to your normal activities? Yes.    Do you have any questions about your discharge instructions: Diet   No. Medications  No. Follow up visit  No.  Do you have questions or concerns about your Care? No.  Actions: * If pain score is 4 or above: No action needed, pain <4.

## 2014-08-20 ENCOUNTER — Encounter: Payer: Self-pay | Admitting: Internal Medicine

## 2014-09-30 ENCOUNTER — Encounter: Payer: 59 | Admitting: Internal Medicine

## 2015-01-14 ENCOUNTER — Telehealth: Payer: Self-pay | Admitting: Internal Medicine

## 2015-01-14 NOTE — Telephone Encounter (Signed)
Patient states she is having symptoms she would like to discuss with you.  Patient states she would like to be worked in with Dr. Asa Lente.  I offered to send to nurse line.  Patient did not want to be transferred.  Patient states she had numbness in both legs last night (started at 5pm and was on and off until 7:15pm) but she does not have it now.

## 2015-01-17 ENCOUNTER — Encounter (HOSPITAL_COMMUNITY): Payer: Self-pay | Admitting: Emergency Medicine

## 2015-01-17 ENCOUNTER — Emergency Department (HOSPITAL_COMMUNITY)
Admission: EM | Admit: 2015-01-17 | Discharge: 2015-01-17 | Disposition: A | Discharge status: Home / Self Care | Payer: 59 | Attending: Emergency Medicine | Admitting: Emergency Medicine

## 2015-01-17 ENCOUNTER — Emergency Department (HOSPITAL_COMMUNITY): Payer: 59

## 2015-01-17 DIAGNOSIS — E119 Type 2 diabetes mellitus without complications: Secondary | ICD-10-CM | POA: Insufficient documentation

## 2015-01-17 DIAGNOSIS — M4806 Spinal stenosis, lumbar region: Secondary | ICD-10-CM | POA: Insufficient documentation

## 2015-01-17 DIAGNOSIS — Z7982 Long term (current) use of aspirin: Secondary | ICD-10-CM | POA: Diagnosis not present

## 2015-01-17 DIAGNOSIS — Z8719 Personal history of other diseases of the digestive system: Secondary | ICD-10-CM | POA: Diagnosis not present

## 2015-01-17 DIAGNOSIS — Z79899 Other long term (current) drug therapy: Secondary | ICD-10-CM | POA: Diagnosis not present

## 2015-01-17 DIAGNOSIS — Z8669 Personal history of other diseases of the nervous system and sense organs: Secondary | ICD-10-CM | POA: Diagnosis not present

## 2015-01-17 DIAGNOSIS — M48061 Spinal stenosis, lumbar region without neurogenic claudication: Secondary | ICD-10-CM

## 2015-01-17 DIAGNOSIS — R200 Anesthesia of skin: Secondary | ICD-10-CM | POA: Insufficient documentation

## 2015-01-17 DIAGNOSIS — R29898 Other symptoms and signs involving the musculoskeletal system: Secondary | ICD-10-CM

## 2015-01-17 DIAGNOSIS — M6281 Muscle weakness (generalized): Secondary | ICD-10-CM | POA: Diagnosis present

## 2015-01-17 LAB — RAPID URINE DRUG SCREEN, HOSP PERFORMED
Amphetamines: NOT DETECTED
Barbiturates: NOT DETECTED
Benzodiazepines: NOT DETECTED
Cocaine: NOT DETECTED
OPIATES: NOT DETECTED
TETRAHYDROCANNABINOL: NOT DETECTED

## 2015-01-17 LAB — CBC WITH DIFFERENTIAL/PLATELET
Basophils Absolute: 0 10*3/uL (ref 0.0–0.1)
Basophils Relative: 1 % (ref 0–1)
EOS PCT: 1 % (ref 0–5)
Eosinophils Absolute: 0.1 10*3/uL (ref 0.0–0.7)
HCT: 44.2 % (ref 36.0–46.0)
Hemoglobin: 14.4 g/dL (ref 12.0–15.0)
LYMPHS ABS: 1.7 10*3/uL (ref 0.7–4.0)
Lymphocytes Relative: 27 % (ref 12–46)
MCH: 31.5 pg (ref 26.0–34.0)
MCHC: 32.6 g/dL (ref 30.0–36.0)
MCV: 96.7 fL (ref 78.0–100.0)
MONO ABS: 0.4 10*3/uL (ref 0.1–1.0)
MONOS PCT: 7 % (ref 3–12)
NEUTROS ABS: 3.9 10*3/uL (ref 1.7–7.7)
NEUTROS PCT: 64 % (ref 43–77)
PLATELETS: 239 10*3/uL (ref 150–400)
RBC: 4.57 MIL/uL (ref 3.87–5.11)
RDW: 12.4 % (ref 11.5–15.5)
WBC: 6.1 10*3/uL (ref 4.0–10.5)

## 2015-01-17 LAB — PROTIME-INR
INR: 1.08 (ref 0.00–1.49)
PROTHROMBIN TIME: 14.2 s (ref 11.6–15.2)

## 2015-01-17 LAB — COMPREHENSIVE METABOLIC PANEL
ALT: 19 U/L (ref 14–54)
ANION GAP: 6 (ref 5–15)
AST: 18 U/L (ref 15–41)
Albumin: 3.8 g/dL (ref 3.5–5.0)
Alkaline Phosphatase: 70 U/L (ref 38–126)
BILIRUBIN TOTAL: 0.9 mg/dL (ref 0.3–1.2)
BUN: 18 mg/dL (ref 6–20)
CO2: 21 mmol/L — ABNORMAL LOW (ref 22–32)
Calcium: 8.7 mg/dL — ABNORMAL LOW (ref 8.9–10.3)
Chloride: 114 mmol/L — ABNORMAL HIGH (ref 101–111)
Creatinine, Ser: 0.88 mg/dL (ref 0.44–1.00)
GFR calc Af Amer: >60 mL/min (ref >60)
GFR calc non Af Amer: >60 mL/min (ref >60)
GLUCOSE: 118 mg/dL — AB (ref 65–99)
Potassium: 3.7 mmol/L (ref 3.5–5.1)
SODIUM: 141 mmol/L (ref 135–145)
TOTAL PROTEIN: 6.6 g/dL (ref 6.5–8.1)

## 2015-01-17 LAB — APTT: aPTT: 32 seconds (ref 24–37)

## 2015-01-17 LAB — URINALYSIS, ROUTINE W REFLEX MICROSCOPIC
Bilirubin Urine: NEGATIVE
Glucose, UA: NEGATIVE mg/dL
KETONES UR: NEGATIVE mg/dL
Leukocytes, UA: NEGATIVE
Nitrite: NEGATIVE
PH: 5.5 (ref 5.0–8.0)
Protein, ur: NEGATIVE mg/dL
Specific Gravity, Urine: 1.008 (ref 1.005–1.030)
Urobilinogen, UA: 0.2 mg/dL (ref 0.0–1.0)

## 2015-01-17 LAB — URINE MICROSCOPIC-ADD ON

## 2015-01-17 LAB — I-STAT TROPONIN, ED: Troponin i, poc: 0 ng/mL (ref 0.00–0.08)

## 2015-01-17 NOTE — ED Provider Notes (Signed)
CSN: [id number]     Arrival date & time 01/17/15  1045 History   First MD Initiated Contact with Patient 01/17/15 1114     Chief Complaint  Patient presents with  . Extremity Weakness     (Consider location/radiation/quality/duration/timing/severity/associated sxs/prior Treatment) Patient is a 63 y.o. female presenting with extremity weakness. The history is provided by the patient.  Extremity Weakness This is a new problem. Episode onset: 4 days ago and then again today. The problem occurs constantly. The problem has been resolved. Pertinent negatives include no chest pain, no abdominal pain, no headaches and no shortness of breath. Nothing aggravates the symptoms. Nothing relieves the symptoms. She has tried nothing for the symptoms. The treatment provided significant relief.    Past Medical History  Diagnosis Date  . Cataract     Bil  . Diverticulosis   . Diabetes mellitus     controlled by diet/ no meds   Past Surgical History  Procedure Laterality Date  . Cataract extraction  2008    Bil  . Tonsillectomy  1957  . Retinal detachment surgery  1991    Bil  . Eye surgery  1991    Detached retina   Family History  Problem Relation Age of Onset  . Arthritis Mother     Osteoarthritis  . Diabetes Mother     Type 2  . Cancer Father     Lung  . Colon cancer Neg Hx   . Stomach cancer Neg Hx   . Rectal cancer Neg Hx    History  Substance Use Topics  . Smoking status: Never Smoker   . Smokeless tobacco: Never Used     Comment: Single-lives alone, 2 dogs  . Alcohol Use: No   OB History    No data available     Review of Systems  Constitutional: Negative for fever and fatigue.  HENT: Negative for congestion and drooling.   Eyes: Negative for pain.  Respiratory: Negative for cough and shortness of breath.   Cardiovascular: Negative for chest pain.  Gastrointestinal: Negative for nausea, vomiting, abdominal pain and diarrhea.  Genitourinary: Negative for dysuria and  hematuria.  Musculoskeletal: Positive for extremity weakness. Negative for back pain, gait problem and neck pain.  Skin: Negative for color change.  Neurological: Positive for weakness and numbness. Negative for dizziness and headaches.  Hematological: Negative for adenopathy.  Psychiatric/Behavioral: Negative for behavioral problems.  All other systems reviewed and are negative.     Allergies  Procaine hcl and Zithromax  Home Medications   Prior to Admission medications   Medication Sig Start Date End Date Taking? Authorizing Provider  aspirin 325 MG tablet Take 650 mg by mouth every 6 (six) hours as needed for moderate pain.   Yes Historical Provider, MD  PROBIOTIC CAPS Take 1 capsule by mouth daily.     Yes Historical Provider, MD  MOVIPREP [place] as directed / no substitutions Patient not taking: Reported on 01/17/2015 07/15/14   Jerene Bears, MD   BP 145/73 mmHg  Pulse 89  Temp(Src) 98.6 F (37 C) (Oral)  Resp 16  SpO2 94% Physical Exam  Constitutional: She is oriented to person, place, and time. She appears well-developed and well-nourished.  HENT:  Head: Normocephalic and atraumatic.  Mouth/Throat: Oropharynx is clear and moist. No oropharyngeal exudate.  TM's occluded mildly w/ cerumen bilaterally.   Eyes: Conjunctivae and EOM are normal. Pupils are equal, round, and reactive to light.  Neck: Normal range of  motion. Neck supple.  Cardiovascular: Normal rate, regular rhythm, normal heart sounds and intact distal pulses.  Exam reveals no gallop and no friction rub.   No murmur heard. Pulmonary/Chest: Effort normal and breath sounds normal. No respiratory distress. She has no wheezes.  Abdominal: Soft. Bowel sounds are normal. There is no tenderness. There is no rebound and no guarding.  Musculoskeletal: Normal range of motion. She exhibits no edema or tenderness.  Neurological: She is alert and oriented to person, place, and time. She displays no Babinski's  sign on the right side. She displays no Babinski's sign on the left side.  Reflex Scores:      Patellar reflexes are 1+ on the right side and 1+ on the left side.      Achilles reflexes are 1+ on the right side and 1+ on the left side. alert, oriented x3 speech: normal in context and clarity memory: intact grossly cranial nerves II-XII: intact motor strength: full proximally and distally no involuntary movements or tremors sensation: intact to light touch diffusely  cerebellar: finger-to-nose and heel-to-shin intact gait: normal forwards and backwards  Skin: Skin is warm and dry.  Psychiatric: She has a normal mood and affect. Her behavior is normal.  Nursing note and vitals reviewed.   ED Course  Procedures (including critical care time) Labs Review Labs Reviewed  URINALYSIS, ROUTINE W REFLEX MICROSCOPIC (NOT AT [facility]) - Abnormal; Notable for the following:    Hgb urine dipstick TRACE (*)    All other components within normal limits  COMPREHENSIVE METABOLIC PANEL - Abnormal; Notable for the following:    Chloride 114 (*)    CO2 21 (*)    Glucose, Bld 118 (*)    Calcium 8.7 (*)    All other components within normal limits  CBC WITH DIFFERENTIAL/PLATELET  URINE MICROSCOPIC-ADD ON  URINE RAPID DRUG SCREEN, HOSP PERFORMED  PROTIME-INR  APTT  I-STAT TROPOININ, ED    Imaging Review Dg Chest 2 View  01/17/2015   CLINICAL DATA:  Intermittent bilateral lower extremity weakness beginning several days ago. Left arm weakness today.  EXAM: CHEST  2 VIEW  COMPARISON:  None.  FINDINGS: The cardiomediastinal silhouette is within normal limits. The lungs are well inflated and clear. There is no evidence of pleural effusion or pneumothorax. No acute osseous abnormality is identified.  IMPRESSION: No active cardiopulmonary disease.   Electronically Signed   By: Logan Bores   On: 01/17/2015 13:02   Ct Head Wo Contrast  01/17/2015   CLINICAL DATA:  Intermittent bilateral leg weakness  beginning several days ago. Left arm weakness today.  EXAM: CT HEAD WITHOUT CONTRAST  TECHNIQUE: Contiguous axial images were obtained from the base of the skull through the vertex without intravenous contrast.  COMPARISON:  Brain MRI 01/06/2011.  Head CT 01/05/2011.  FINDINGS: Ventricles sulci are unchanged and within normal limits for age. There is no evidence of acute cortical infarct, intracranial hemorrhage, mass, midline shift, or extra-axial fluid collection.  Prior bilateral cataract extraction and right scleral banding are noted. Visualized mastoid air cells and paranasal sinuses are clear.  IMPRESSION: No evidence of acute intracranial abnormality.   Electronically Signed   By: Logan Bores   On: 01/17/2015 13:19   Ct Lumbar Spine Wo Contrast  01/17/2015   CLINICAL DATA:  Intermittent lower extremity weakness. Initial episode 5 days ago.  EXAM: CT LUMBAR SPINE WITHOUT CONTRAST  TECHNIQUE: Multidetector CT imaging of the lumbar spine was performed without intravenous contrast administration. Multiplanar CT  image reconstructions were also generated.  COMPARISON:  None.  FINDINGS: There is mild curvature convex to the right with the apex at L2-3.  T12-L1:  Mild bulging of the disc.  No canal or foraminal stenosis.  L1-2: Endplate osteophytes and bulging of the disc. Mild facet and ligamentous hypertrophy. Moderate canal stenosis. Foraminal narrowing right more than left because of asymmetric disc material. Findings at this level could be symptomatic.  L2-3: Endplate osteophytes and bulging of the disc. Facet and ligamentous hypertrophy. Moderate canal stenosis which could be symptomatic.  L3-4: Endplate osteophytes and bulging of the disc. Mild facet and ligamentous hypertrophy. Moderate canal stenosis which could be symptomatic.  L4-5: Endplate osteophytes and bulging of the disc. Facet and ligamentous hypertrophy. Moderate to severe canal and foraminal stenosis that could cause neural compression.   L5-S1: Mild bulging of the disc. Mild facet degeneration. No central canal stenosis. Mild foraminal narrowing without gross neural compression.  Sacroiliac joints appear unremarkable.  IMPRESSION: Mild curvature convex to the right.  Multilevel degenerative disc disease and degenerative facet disease. Spinal stenosis that could possibly be symptomatic at the L1-2, L2-3, L3-4 and L4-5 levels.   Electronically Signed   By: Nelson Chimes M.D.   On: 01/17/2015 14:48     EKG Interpretation None      MDM   Final diagnoses:  Complaints of weakness of lower extremity  Lumbar spinal stenosis    12:18 PM 64 y.o. female with history of borderline diabetes controlled with diet who presents with lower extremity weakness and numbness. She first noticed the symptoms 4 days ago while at a gas station. Her symptoms lasted 1-2 hours and then resolve spontaneously. Yesterday she noted she had some discomfort in her chest for several minutes while riding a sitting mower. Her symptoms resolved after she called off with a wet towel. Today she awoke around 6 AM and noted that the lower extremity weakness and tingling had recurred. She notes it lasted about 5-6 hours and has since resolved. She has a normal neurologic exam currently. We'll get screening labs and imaging.  Pt notes that she pulled a tick off her body several days ago and a different tick about 10 days ago. She denies fevers, rashes, HA's, or back pain.   I discussed the case with Dr. Doy Mince from neurology. Will get a CT of her lumbar spine to rule out any bony abnormalities and recommended patient start taking an aspirin daily.  3:26 PM: CT showing spinal stenosis which could possibly be causing the patient's symptoms although she denies any pain. Do not think this is a CVA. Given resolution of symptoms do not think this is a spinal stroke. Tick paralysis considered but the patient has removed the tick so I would expect her symptoms to have resolved.  Given her non-contributory workup, normal vital signs, and resolution of symptoms I think it is reasonable to have the patient follow up closely with her primary care doctor. I have discussed the diagnosis/risks/treatment options with the patient and believe the pt to be eligible for discharge home to follow-up with her pcp. We also discussed returning to the ED immediately if new or worsening sx occur. We discussed the sx which are most concerning (e.g., worsening weakness, numbness, fever, falls, rash) that necessitate immediate return. Medications administered to the patient during their visit and any new prescriptions provided to the patient are listed below.  Medications given during this visit Medications - No data to display  New Prescriptions  No medications on file       Pamella Pert, MD 01/17/15 1528

## 2015-01-17 NOTE — ED Notes (Addendum)
Pt reports intermittent bilateral leg weakness that started on Tuesday. Pt also reports she has been having some L arm weakness today. Pt moving all extremities with no difficulty. Ambulatory to room. Pt also reports indigestion yesterday along with jaw pain while mowing that was relieved when she took her cold neck towel off. Also reports R ear feels blocked up.

## 2015-01-19 NOTE — Telephone Encounter (Signed)
LVM for pt to call back as soon as possible.   RE: message below 

## 2015-01-20 ENCOUNTER — Ambulatory Visit (INDEPENDENT_AMBULATORY_CARE_PROVIDER_SITE_OTHER): Payer: 59 | Admitting: Internal Medicine

## 2015-01-20 ENCOUNTER — Encounter: Payer: Self-pay | Admitting: Internal Medicine

## 2015-01-20 ENCOUNTER — Telehealth: Payer: Self-pay | Admitting: *Deleted

## 2015-01-20 ENCOUNTER — Other Ambulatory Visit (INDEPENDENT_AMBULATORY_CARE_PROVIDER_SITE_OTHER): Payer: 59

## 2015-01-20 VITALS — BP 150/90 | HR 83 | Temp 98.3°F | Resp 16 | Wt 245.0 lb

## 2015-01-20 DIAGNOSIS — R739 Hyperglycemia, unspecified: Secondary | ICD-10-CM

## 2015-01-20 DIAGNOSIS — M48061 Spinal stenosis, lumbar region without neurogenic claudication: Secondary | ICD-10-CM

## 2015-01-20 DIAGNOSIS — R200 Anesthesia of skin: Secondary | ICD-10-CM

## 2015-01-20 DIAGNOSIS — M4806 Spinal stenosis, lumbar region: Secondary | ICD-10-CM | POA: Diagnosis not present

## 2015-01-20 DIAGNOSIS — R208 Other disturbances of skin sensation: Secondary | ICD-10-CM

## 2015-01-20 LAB — HEMOGLOBIN A1C: Hgb A1c MFr Bld: 6.1 % (ref 4.6–6.5)

## 2015-01-20 LAB — VITAMIN B12: VITAMIN B 12: 309 pg/mL (ref 211–911)

## 2015-01-20 LAB — TSH: TSH: 2.87 u[IU]/mL (ref 0.35–4.50)

## 2015-01-20 NOTE — Progress Notes (Signed)
Pre visit review using our clinic review tool, if applicable. No additional management support is needed unless otherwise documented below in the visit note. 

## 2015-01-20 NOTE — Patient Instructions (Signed)
  Your next office appointment will be determined based upon review of your pending labs. Those written interpretation of the lab results and instructions will be transmitted to you by My Chart  Critical results will be called.   Followup as needed for any active or acute issue. Please report any significant change in your symptoms. 

## 2015-01-20 NOTE — Progress Notes (Signed)
   Subjective:    Patient ID: [name], female    DOB: March 11, 1951, 64 y.o.   MRN: [id number]  HPI  She is here for follow-up from the ER 01/17/15. Those records were reviewed. Approximately 5 PM on 01/13/15 she noted weakness as well as numbness in the lower extremities which lasted 1-2 hours. She had a recurrence of the numbness without weakness for 5-6 hours on 6/11 in the early morning. She also had a recurrence minimally on 6/12 and significantly yesterday morning 6/13 . She has not had recurrence of the weakness in the lower extremities.  Also on 611 and 6/13 she had some numbness intermittently over 1.5 hours in the left upper extremity as well.  There was no cardio neuro prodrome prior to any of these episodes. Denied were any change in heart rhythm or rate prior to the event. There was no associated chest pain or shortness of breath .  Also specifically denied prior to the episode were headache, limb weakness, tingling, or numbness. No seizure activity noted.  CT scan of the head was negative. CT of the spine revealed degenerative disc disease with spinal stenosis L1-L5. Random glucose was 118. Dr. Doy Mince was consulted by phone; aspirin prophylaxis was recommended.  The chart was reviewed. Her B12 level was 579 in 2012. A1c was 5.8% in December 2014.   Review of Systems Since the ER visit she has not had fever, chills, sweats, or unexplained weight loss . Also denied are significant headaches;mental status change or memory loss . Blurred vision , diplopia or vision loss absent. Vertigo, near syncope or imbalance denied. No loss of control of bladder or bowels. Radicular type pain absent.       Objective:   Physical Exam  Pertinent or positive findings include: There is subtle asymmetry of the nasolabial fold. With tiptoe walking she is unsteady due to instability of her knees. She does have crepitus of the knees. There is no other neurologic deficit. Neurologic exam : Cn  2-7 intact Strength equal & normal in upper & lower extremities Able to walk on heels and toes.   Balance normal  Romberg normal, finger to nose normal.   General appearance :adequately nourished; in no distress.  Eyes: No conjunctival inflammation or scleral icterus is present.  Oral exam:  Lips and gums are healthy appearing.There is no oropharyngeal erythema or exudate noted. Dental hygiene is good.  Heart:  Normal rate and regular rhythm. S1 and S2 normal without gallop, murmur, click, rub or other extra sounds    Lungs:Chest clear to auscultation; no wheezes, rhonchi,rales ,or rubs present.No increased work of breathing.   Abdomen: bowel sounds normal, soft and non-tender without masses, organomegaly or hernias noted.  No guarding or rebound.   Vascular : all pulses equal ; no bruits present.  Skin:Warm & dry.  Intact without suspicious lesions or rashes ; no tenting or jaundice   Lymphatic: No lymphadenopathy is noted about the head, neck, axilla Neuro: DTRs normal.         Assessment & Plan:  #1 numbness o& weakness in the lower extremity as well as numbness in left upper extremity. The weakness was isolated event. The numbness has been recurrent.  #2 spinal stenosis due to degenerative disc disease  #3 hyperglycemia.  See orders

## 2015-01-20 NOTE — Telephone Encounter (Signed)
[facility] - Client TELEPHONE ADVICE RECORD [facility] Patient Name: [name] Gender: Female DOB: [birth year] Age: 64 Y 31 M 28 D Return Phone Number: [phone] (Primary) Address: [address] City/State/Zip: [place] [address] Client [place] [facility] - Client Client Site [place] - Night Physician [place] Type Call Call Type Triage / Clinical Relationship To Patient Self Return Phone Number [phone] (Primary) Chief Complaint Numbness Initial Comment Caller States she has numbness in her legs, feels like they are both asleep [facility] ED PreDisposition Call another nurse Nurse Assessment Nurse: Venetia Maxon, RN, Manuela Schwartz Date/Time (Eastern Time): 01/17/2015 9:52:01 AM Confirm and document reason for call. If symptomatic, describe symptoms. ---Caller States she has numbness in her legs, feels like they are both asleep Since Tuesday She took a pill called Release but none recently. No fever . no loss of bowel or bladder. She works at an [employer]. She did not have it Thurs but Friday she was worse again but able to [place] on mower. she takes ASA OTC she took 2 this am and "always takes Aspirin ". Has the patient traveled out of the country within the last 30 days? ---No Does the patient require triage? ---Yes Related visit to physician within the last 2 weeks? ---No Does the PT have any chronic conditions? (i.e. diabetes, asthma, etc.) ---Yes List chronic conditions. ---Probiotics Guidelines Guideline Title Affirmed Question Affirmed Notes Nurse Date/Time (Eastern Time) Neurologic Deficit [1] Numbness (i.e., loss of sensation) of the face, arm or leg on one side of the body AND [2] sudden onset AND [3] transient (i.e., completely resolved) Venetia Maxon, RN, Manuela Schwartz 01/17/2015 9:55:16 AM Disp. Time Eilene Ghazi Time) Disposition Final  User 01/17/2015 9:57:30 AM Go to ED Now (or PCP triage) Yes Venetia Maxon, RN, Liliane Bade NOTE: All timestamps contained within this report are represented as [place] Standard Time. CONFIDENTIALTY NOTICE: This fax transmission is intended only for the addressee. It contains information that is legally privileged, confidential or otherwise protected from use or disclosure. If you are not the intended recipient, you are strictly prohibited from reviewing, disclosing, copying using or disseminating any of this information or taking any action in reliance on or regarding this information. If you have received this fax in error, please notify us immediately by telephone so that we can arrange for its return to [place]. Phone: [phone], Toll-Free: [phone], Fax: [phone] Page: 2 of 2 Call Id: [id number] Caller Understands: Yes Disagree/Comply: Comply Care Advice Given Per Guideline GO TO ED NOW (OR PCP TRIAGE): DRIVING: Another adult should drive. CARE ADVICE given per Neurologic Deficit (Adult) guideline. After Care Instructions Given Call Event Type User Date / Time Description Referrals [facility] - ED

## 2015-01-21 LAB — VDRL: VDRL, SERUM: NONREACTIVE

## 2015-02-02 ENCOUNTER — Other Ambulatory Visit: Payer: Self-pay

## 2015-02-15 ENCOUNTER — Other Ambulatory Visit (HOSPITAL_COMMUNITY): Payer: 59

## 2015-02-15 ENCOUNTER — Observation Stay (HOSPITAL_COMMUNITY)
Admission: EM | Admit: 2015-02-15 | Discharge: 2015-02-16 | Disposition: A | Discharge status: Home / Self Care | Payer: 59 | Attending: Internal Medicine | Admitting: Internal Medicine

## 2015-02-15 ENCOUNTER — Encounter (HOSPITAL_COMMUNITY): Payer: Self-pay | Admitting: Emergency Medicine

## 2015-02-15 ENCOUNTER — Emergency Department (HOSPITAL_COMMUNITY): Payer: 59

## 2015-02-15 DIAGNOSIS — Z8249 Family history of ischemic heart disease and other diseases of the circulatory system: Secondary | ICD-10-CM | POA: Diagnosis not present

## 2015-02-15 DIAGNOSIS — E119 Type 2 diabetes mellitus without complications: Secondary | ICD-10-CM | POA: Insufficient documentation

## 2015-02-15 DIAGNOSIS — R12 Heartburn: Secondary | ICD-10-CM | POA: Diagnosis not present

## 2015-02-15 DIAGNOSIS — Z79899 Other long term (current) drug therapy: Secondary | ICD-10-CM | POA: Insufficient documentation

## 2015-02-15 DIAGNOSIS — E669 Obesity, unspecified: Secondary | ICD-10-CM | POA: Diagnosis not present

## 2015-02-15 DIAGNOSIS — M4800 Spinal stenosis, site unspecified: Secondary | ICD-10-CM | POA: Insufficient documentation

## 2015-02-15 DIAGNOSIS — Z7982 Long term (current) use of aspirin: Secondary | ICD-10-CM | POA: Insufficient documentation

## 2015-02-15 DIAGNOSIS — Z6837 Body mass index (BMI) 37.0-37.9, adult: Secondary | ICD-10-CM | POA: Diagnosis not present

## 2015-02-15 DIAGNOSIS — R0789 Other chest pain: Principal | ICD-10-CM | POA: Insufficient documentation

## 2015-02-15 DIAGNOSIS — R079 Chest pain, unspecified: Secondary | ICD-10-CM | POA: Diagnosis present

## 2015-02-15 DIAGNOSIS — K5790 Diverticulosis of intestine, part unspecified, without perforation or abscess without bleeding: Secondary | ICD-10-CM | POA: Diagnosis not present

## 2015-02-15 LAB — HEPATIC FUNCTION PANEL
ALK PHOS: 78 U/L (ref 38–126)
ALT: 33 U/L (ref 14–54)
AST: 48 U/L — AB (ref 15–41)
Albumin: 4 g/dL (ref 3.5–5.0)
BILIRUBIN INDIRECT: 0.6 mg/dL (ref 0.3–0.9)
Bilirubin, Direct: 0.3 mg/dL (ref 0.1–0.5)
Total Bilirubin: 0.9 mg/dL (ref 0.3–1.2)
Total Protein: 7.1 g/dL (ref 6.5–8.1)

## 2015-02-15 LAB — I-STAT TROPONIN, ED: Troponin i, poc: 0 ng/mL (ref 0.00–0.08)

## 2015-02-15 LAB — CBC
HEMATOCRIT: 46.4 % — AB (ref 36.0–46.0)
HEMOGLOBIN: 15.2 g/dL — AB (ref 12.0–15.0)
MCH: 31.5 pg (ref 26.0–34.0)
MCHC: 32.8 g/dL (ref 30.0–36.0)
MCV: 96.1 fL (ref 78.0–100.0)
PLATELETS: 250 10*3/uL (ref 150–400)
RBC: 4.83 MIL/uL (ref 3.87–5.11)
RDW: 12.4 % (ref 11.5–15.5)
WBC: 10.4 10*3/uL (ref 4.0–10.5)

## 2015-02-15 LAB — BASIC METABOLIC PANEL
Anion gap: 9 (ref 5–15)
BUN: 16 mg/dL (ref 6–20)
CALCIUM: 9.2 mg/dL (ref 8.9–10.3)
CO2: 24 mmol/L (ref 22–32)
CREATININE: 0.92 mg/dL (ref 0.44–1.00)
Chloride: 106 mmol/L (ref 101–111)
GLUCOSE: 160 mg/dL — AB (ref 65–99)
Potassium: 4 mmol/L (ref 3.5–5.1)
Sodium: 139 mmol/L (ref 135–145)

## 2015-02-15 LAB — TROPONIN I: Troponin I: <0.03 ng/mL (ref <0.031)

## 2015-02-15 LAB — LIPASE, BLOOD: Lipase: 19 U/L — ABNORMAL LOW (ref 22–51)

## 2015-02-15 MED ORDER — GI COCKTAIL ~~LOC~~
30.0000 mL | Freq: Once | ORAL | Status: AC
  Administered 2015-02-15: 30 mL via ORAL
  Filled 2015-02-15: qty 30

## 2015-02-15 MED ORDER — NITROGLYCERIN 0.4 MG SL SUBL: 0.4000 mg | SUBLINGUAL_TABLET | SUBLINGUAL | Status: DC | PRN

## 2015-02-15 MED ORDER — PANTOPRAZOLE SODIUM 40 MG PO TBEC
40.0000 mg | DELAYED_RELEASE_TABLET | Freq: Every day | ORAL | Status: DC
  Administered 2015-02-16: 40 mg via ORAL
  Filled 2015-02-15 (×2): qty 1

## 2015-02-15 MED ORDER — ASPIRIN EC 81 MG PO TBEC
81.0000 mg | DELAYED_RELEASE_TABLET | Freq: Every day | ORAL | Status: DC
  Administered 2015-02-15 – 2015-02-16 (×2): 81 mg via ORAL
  Filled 2015-02-15 (×3): qty 1

## 2015-02-15 MED ORDER — ENOXAPARIN SODIUM 60 MG/0.6ML ~~LOC~~ SOLN
0.5000 mg/kg | SUBCUTANEOUS | Status: DC
  Administered 2015-02-15: 55 mg via SUBCUTANEOUS
  Filled 2015-02-15: qty 0.6

## 2015-02-15 MED ORDER — SODIUM CHLORIDE 0.9 % IV SOLN
INTRAVENOUS | Status: DC
  Administered 2015-02-15: 17:00:00 via INTRAVENOUS

## 2015-02-15 MED ORDER — KETOROLAC TROMETHAMINE 30 MG/ML IJ SOLN
15.0000 mg | Freq: Four times a day (QID) | INTRAMUSCULAR | Status: DC | PRN
  Administered 2015-02-15 (×2): 15 mg via INTRAVENOUS
  Filled 2015-02-15 (×2): qty 1

## 2015-02-15 MED ORDER — MORPHINE SULFATE 2 MG/ML IJ SOLN: 1.0000 mg | Freq: Four times a day (QID) | INTRAMUSCULAR | Status: DC | PRN

## 2015-02-15 NOTE — ED Notes (Signed)
I have just been instructed by our secretary that 4th floor have called in a nurse who will be here at 4:30pm and pt. May go up then.

## 2015-02-15 NOTE — ED Notes (Signed)
She remains pain-free and in no distress.  She agrees with plan to admit overnight, as discussed by Dr. Roderic Palau.

## 2015-02-15 NOTE — ED Provider Notes (Signed)
CSN: [id number]     Arrival date & time 02/15/15  1041 History   First MD Initiated Contact with Patient 02/15/15 1048     Chief Complaint  Patient presents with  . Chest Pain     (Consider location/radiation/quality/duration/timing/severity/associated sxs/prior Treatment) Patient is a 64 y.o. female presenting with chest pain. The history is provided by the patient (the pt complains of chest pain for 2 hours today).  Chest Pain Pain location:  Substernal area Pain quality: aching   Pain radiates to the back: yes   Pain severity:  Moderate Onset quality:  Sudden Timing:  Constant Progression:  Resolved Chronicity:  Recurrent Associated symptoms: no abdominal pain, no back pain, no cough, no fatigue and no headache     Past Medical History  Diagnosis Date  . Cataract     Bil  . Diverticulosis   . Diabetes mellitus     controlled by diet/ no meds   Past Surgical History  Procedure Laterality Date  . Cataract extraction  2008    Bil  . Tonsillectomy  1957  . Retinal detachment surgery  1991    Bil  . Eye surgery  1991    Detached retina   Family History  Problem Relation Age of Onset  . Arthritis Mother     Osteoarthritis  . Diabetes Mother     Type 2  . Cancer Father     Lung  . Colon cancer Neg Hx   . Stomach cancer Neg Hx   . Rectal cancer Neg Hx    History  Substance Use Topics  . Smoking status: Never Smoker   . Smokeless tobacco: Never Used     Comment: Single-lives alone, 2 dogs  . Alcohol Use: No   OB History    No data available     Review of Systems  Constitutional: Negative for appetite change and fatigue.  HENT: Negative for congestion, ear discharge and sinus pressure.   Eyes: Negative for discharge.  Respiratory: Negative for cough.   Cardiovascular: Positive for chest pain.  Gastrointestinal: Negative for abdominal pain and diarrhea.  Genitourinary: Negative for frequency and hematuria.  Musculoskeletal: Negative for back pain.   Skin: Negative for rash.  Neurological: Negative for seizures and headaches.  Psychiatric/Behavioral: Negative for hallucinations.      Allergies  Procaine hcl and Zithromax  Home Medications   Prior to Admission medications   Medication Sig Start Date End Date Taking? Authorizing Provider  aspirin (ASPIRIN EC) 81 MG EC tablet Take 81 mg by mouth daily. Swallow whole.   Yes Historical Provider, MD  Ca Carbonate-Mag Hydroxide (ROLAIDS PO) Take 1 tablet by mouth once.   Yes Historical Provider, MD  Polyethyl Glycol-Propyl Glycol (SYSTANE OP) Apply 1-2 drops to eye daily as needed (dry eyes).   Yes Historical Provider, MD  PROBIOTIC CAPS Take 1 capsule by mouth daily.     Yes Historical Provider, MD  MOVIPREP [facility] as directed / no substitutions Patient not taking: Reported on 02/15/2015 07/15/14   Jerene Bears, MD   BP 140/64 mmHg  Pulse 71  Temp(Src) 98.9 F (37.2 C) (Oral)  Resp 14  SpO2 96% Physical Exam  Constitutional: She is oriented to person, place, and time. She appears well-developed.  HENT:  Head: Normocephalic.  Eyes: Conjunctivae and EOM are normal. No scleral icterus.  Neck: Neck supple. No thyromegaly present.  Cardiovascular: Normal rate and regular rhythm.  Exam reveals no gallop and no friction rub.  No murmur heard. Pulmonary/Chest: No stridor. She has no wheezes. She has no rales. She exhibits no tenderness.  Abdominal: She exhibits no distension. There is no tenderness. There is no rebound.  Musculoskeletal: Normal range of motion. She exhibits no edema.  Lymphadenopathy:    She has no cervical adenopathy.  Neurological: She is oriented to person, place, and time. She exhibits normal muscle tone. Coordination normal.  Skin: No rash noted. No erythema.  Psychiatric: She has a normal mood and affect. Her behavior is normal.    ED Course  Procedures (including critical care time) Labs Review Labs Reviewed  CBC - Abnormal; Notable for the  following:    Hemoglobin 15.2 (*)    HCT 46.4 (*)    All other components within normal limits  BASIC METABOLIC PANEL - Abnormal; Notable for the following:    Glucose, Bld 160 (*)    All other components within normal limits  HEPATIC FUNCTION PANEL - Abnormal; Notable for the following:    AST 48 (*)    All other components within normal limits  LIPASE, BLOOD - Abnormal; Notable for the following:    Lipase 19 (*)    All other components within normal limits  I-STAT TROPOININ, ED    Imaging Review Dg Chest 2 View  02/15/2015   CLINICAL DATA:  Acute chest pain today.  EXAM: CHEST  2 VIEW  COMPARISON:  01/17/2015 chest radiograph  FINDINGS: The cardiomediastinal silhouette is unremarkable.  Minimal bibasilar atelectasis noted.  There is no evidence of focal airspace disease, pulmonary edema, suspicious pulmonary nodule/mass, pleural effusion, or pneumothorax. No acute bony abnormalities are identified.  IMPRESSION: Minimal bibasilar atelectasis without other significant abnormality.   Electronically Signed   By: Margarette Canada M.D.   On: 02/15/2015 11:58     EKG Interpretation   Date/Time:  Sunday February 15 2015 10:49:56 EDT Ventricular Rate:  82 PR Interval:  156 QRS Duration: 80 QT Interval:  365 QTC Calculation: 426 R Axis:   60 Text Interpretation:  Sinus rhythm Baseline wander in lead(s) II III aVF  Confirmed by Alley Neils  MD, Broadus John [phone]) on 02/15/2015 11:05:50 AM Also  confirmed by Meia Emley  MD, Broadus John [phone])  on 02/15/2015 2:37:13 PM      MDM   Final diagnoses:  Chest pain at rest    Chest pain,  Admit for rule out of cad    Milton Ferguson, MD 02/15/15 1503

## 2015-02-15 NOTE — ED Notes (Signed)
Immediately after injecting the IV toradol pt. States her pain is "gone".  She thanks [place] for our care.

## 2015-02-15 NOTE — H&P (Signed)
[facility] History and Physical  [name] YHC:[id number] DOB: 21-Oct-1950 DOA: 02/15/2015  Referring physician: EDP PCP: Gwendolyn Grant, MD   Chief Complaint: chest pressure and aching pain in the back in the shoulder blades.  HPI: [name] is a 64 y.o. female with no prior medical history, came in for intermittent chest pressures and some aching , cramping pain int he shoulder blades over the last 2 weeks. She reports pain initially started after she mowed her lawn during daytime. She reports being sweaty when it happened. She denies sharp pain in the chest or between the shoulder blades. She denies fever or chills, nausea, palpitations, abdominal pain. On arrival to ED, she was found to be slightly dehydrated with elevated hematocrit. EKG does not show any ischemic changes. Troponin was negative. She was referred to medical service for admission for evaluation of ACS. She also reported some indigestion when she has chest pressure.    Review of Systems:  Constitutional:  No weight loss, night sweats, Fevers, chills, fatigue.  HEENT:  No headaches, Difficulty swallowing,Tooth/dental problems,Sore throat,  No sneezing, itching, ear ache, nasal congestion, post nasal drip,  Cardio-vascular:  Chest pressure present .  GI:  No heartburn, indigestion, abdominal pain, nausea, vomiting, diarrhea, change in bowel habits, loss of appetite  Resp:  No shortness of breath with exertion or at rest. No excess mucus, no productive cough, No non-productive cough, No coughing up of blood.No change in color of mucus.No wheezing.No chest wall deformity  Skin:  no rash or lesions.  GU:  no dysuria, change in color of urine, no urgency or frequency. No flank pain.  Musculoskeletal:  No joint pain or swelling. No decreased range of motion. No back pain.  Psych:  No change in mood or affect. No depression or anxiety. No memory loss.   Past Medical History  Diagnosis Date  . Cataract       Bil  . Diverticulosis   . Diabetes mellitus     controlled by diet/ no meds   Past Surgical History  Procedure Laterality Date  . Cataract extraction  2008    Bil  . Tonsillectomy  1957  . Retinal detachment surgery  1991    Bil  . Eye surgery  1991    Detached retina   Social History:  reports that she has never smoked. She has never used smokeless tobacco. She reports that she does not drink alcohol or use illicit drugs.  Allergies  Allergen Reactions  . Procaine Hcl Other (See Comments)    Novacaine-unknown side effects as child  . Zithromax [Azithromycin Dihydrate] Other (See Comments)    Numbness body    Family History  Problem Relation Age of Onset  . Arthritis Mother     Osteoarthritis  . Diabetes Mother     Type 2  . Cancer Father     Lung  . Colon cancer Neg Hx   . Stomach cancer Neg Hx   . Rectal cancer Neg Hx    Prior to Admission medications   Medication Sig Start Date End Date Taking? Authorizing Provider  aspirin (ASPIRIN EC) 81 MG EC tablet Take 81 mg by mouth daily. Swallow whole.   Yes Historical Provider, MD  Ca Carbonate-Mag Hydroxide (ROLAIDS PO) Take 1 tablet by mouth once.   Yes Historical Provider, MD  Polyethyl Glycol-Propyl Glycol (SYSTANE OP) Apply 1-2 drops to eye daily as needed (dry eyes).   Yes Historical Provider, MD  PROBIOTIC CAPS Take 1  capsule by mouth daily.     Yes Historical Provider, MD  MOVIPREP [place] as directed / no substitutions Patient not taking: Reported on 02/15/2015 07/15/14   Jerene Bears, MD   Physical Exam: Filed Vitals:   02/15/15 1344 02/15/15 1507 02/15/15 1700 02/15/15 1708  BP: 140/64 146/69  149/80  Pulse: 71 69  70  Temp: 98.9 F (37.2 C) 98.3 F (36.8 C)  98.1 F (36.7 C)  TempSrc: Oral Oral  Oral  Resp: 14 14  16   Height:   5\' 8"  (1.727 m)   Weight:   111.948 kg (246 lb 12.8 oz)   SpO2: 96% 97%  95%    Wt Readings from Last 3 Encounters:  02/15/15 111.948 kg (246 lb 12.8 oz)   01/20/15 111.131 kg (245 lb)  08/13/14 112.946 kg (249 lb)    General:  Appears calm and comfortable Eyes: PERRL, normal lids, irises & conjunctiva ENT: grossly normal hearing, lips & tongue Neck: no LAD, masses or thyromegaly Cardiovascular: RRR, no m/r/g. No LE edema. Telemetry: SR, no arrhythmias  Respiratory: CTA bilaterally, no w/r/r. Normal respiratory effort. Abdomen: soft, ntnd Skin: no rash or induration seen on limited exam Musculoskeletal: grossly normal tone BUE/BLE Psychiatric: grossly normal mood and affect, speech fluent and appropriate Neurologic: grossly non-focal.          Labs on Admission:  Basic Metabolic Panel:  Recent Labs Lab 02/15/15 1101  NA 139  K 4.0  CL 106  CO2 24  GLUCOSE 160*  BUN 16  CREATININE 0.92  CALCIUM 9.2   Liver Function Tests:  Recent Labs Lab 02/15/15 1110  AST 48*  ALT 33  ALKPHOS 78  BILITOT 0.9  PROT 7.1  ALBUMIN 4.0    Recent Labs Lab 02/15/15 1110  LIPASE 19*   No results for input(s): AMMONIA in the last 168 hours. CBC:  Recent Labs Lab 02/15/15 1101  WBC 10.4  HGB 15.2*  HCT 46.4*  MCV 96.1  PLT 250   Cardiac Enzymes:  Recent Labs Lab 02/15/15 1732  TROPONINI <0.03    BNP (last 3 results) No results for input(s): BNP in the last 8760 hours.  ProBNP (last 3 results) No results for input(s): PROBNP in the last 8760 hours.  CBG: No results for input(s): GLUCAP in the last 168 hours.  Radiological Exams on Admission: Dg Chest 2 View  02/15/2015   CLINICAL DATA:  Acute chest pain today.  EXAM: CHEST  2 VIEW  COMPARISON:  01/17/2015 chest radiograph  FINDINGS: The cardiomediastinal silhouette is unremarkable.  Minimal bibasilar atelectasis noted.  There is no evidence of focal airspace disease, pulmonary edema, suspicious pulmonary nodule/mass, pleural effusion, or pneumothorax. No acute bony abnormalities are identified.  IMPRESSION: Minimal bibasilar atelectasis without other significant  abnormality.   Electronically Signed   By: Margarette Canada M.D.   On: 02/15/2015 11:58    EKG:  Reviewed, sinus rhythm.  Assessment/Plan Active Problems:   Chest pain   Atypical chest Pain: probably musculoskeletal as she reports pain occuring after mowing her lawn and her pain is int he back, between shoulder blades, and is achy and crampy in nature, and pain relieved by toradol.  Initial EKG is negative for ischemic changes.  Serial troponins and repeat EKG in am .  Get echocardiogram in am.  If her troponins are negative , then plan for stress test in am.  As she reports strong family history of MI at the age of  60's.  Lipid panel ordered.  protonix for heartburn. HYDRATION and repeat labs in am.   Code Status: full code.  DVT Prophylaxis: Family Communication: discussed with family at bedside Disposition Plan: pending.   Time spent: 50 min  [facility] Pager [phone]

## 2015-02-15 NOTE — ED Notes (Signed)
She c/o "pressure" at upper back "between my shoulder blades" which is ~4/10 on pain scale.  EKG performed and shown to Dr. Roderic Palau.  Per Dr. Roderic Palau, no new orders and plan to proceed with plan to admit.

## 2015-02-15 NOTE — ED Notes (Signed)
Per Tonna Boehringer RN 4th, getting a patient from 5th floor. Will be changing room numbers. To call when room available.

## 2015-02-15 NOTE — ED Notes (Signed)
Pt c/o left chest pressure and medial upper back pain onset today at 1000, along with diaphoresis.

## 2015-02-16 ENCOUNTER — Encounter (HOSPITAL_COMMUNITY): Payer: Self-pay

## 2015-02-16 ENCOUNTER — Other Ambulatory Visit: Payer: Self-pay

## 2015-02-16 ENCOUNTER — Emergency Department (HOSPITAL_COMMUNITY)
Admission: EM | Admit: 2015-02-16 | Discharge: 2015-02-16 | Disposition: A | Discharge status: Home / Self Care | Payer: 59 | Source: Home / Self Care | Attending: Emergency Medicine | Admitting: Emergency Medicine

## 2015-02-16 ENCOUNTER — Ambulatory Visit (HOSPITAL_COMMUNITY)
Admission: EM | Admit: 2015-02-16 | Discharge: 2015-02-16 | Disposition: A | Discharge status: Home / Self Care | Payer: 59 | Source: Home / Self Care | Attending: Internal Medicine | Admitting: Internal Medicine

## 2015-02-16 ENCOUNTER — Observation Stay (HOSPITAL_COMMUNITY): Payer: 59

## 2015-02-16 DIAGNOSIS — M545 Low back pain: Secondary | ICD-10-CM

## 2015-02-16 DIAGNOSIS — R0789 Other chest pain: Secondary | ICD-10-CM | POA: Insufficient documentation

## 2015-02-16 DIAGNOSIS — E119 Type 2 diabetes mellitus without complications: Secondary | ICD-10-CM

## 2015-02-16 DIAGNOSIS — R079 Chest pain, unspecified: Secondary | ICD-10-CM

## 2015-02-16 DIAGNOSIS — Z8669 Personal history of other diseases of the nervous system and sense organs: Secondary | ICD-10-CM

## 2015-02-16 DIAGNOSIS — Z79899 Other long term (current) drug therapy: Secondary | ICD-10-CM

## 2015-02-16 DIAGNOSIS — Z7982 Long term (current) use of aspirin: Secondary | ICD-10-CM | POA: Insufficient documentation

## 2015-02-16 DIAGNOSIS — R12 Heartburn: Secondary | ICD-10-CM | POA: Diagnosis not present

## 2015-02-16 DIAGNOSIS — K5790 Diverticulosis of intestine, part unspecified, without perforation or abscess without bleeding: Secondary | ICD-10-CM | POA: Diagnosis not present

## 2015-02-16 LAB — BASIC METABOLIC PANEL
ANION GAP: 6 (ref 5–15)
ANION GAP: 10 (ref 5–15)
BUN: 16 mg/dL (ref 6–20)
BUN: 12 mg/dL (ref 6–20)
CO2: 24 mmol/L (ref 22–32)
CO2: 23 mmol/L (ref 22–32)
CREATININE: 1.08 mg/dL — AB (ref 0.44–1.00)
Calcium: 8.5 mg/dL — ABNORMAL LOW (ref 8.9–10.3)
Calcium: 9 mg/dL (ref 8.9–10.3)
Chloride: 107 mmol/L (ref 101–111)
Chloride: 109 mmol/L (ref 101–111)
Creatinine, Ser: 1.19 mg/dL — ABNORMAL HIGH (ref 0.44–1.00)
GFR calc Af Amer: >60 mL/min (ref >60)
GFR calc non Af Amer: 53 mL/min — ABNORMAL LOW (ref >60)
GFR calc non Af Amer: 47 mL/min — ABNORMAL LOW (ref >60)
GFR, EST AFRICAN AMERICAN: 55 mL/min — AB (ref >60)
GLUCOSE: 153 mg/dL — AB (ref 65–99)
Glucose, Bld: 165 mg/dL — ABNORMAL HIGH (ref 65–99)
Potassium: 3.6 mmol/L (ref 3.5–5.1)
Potassium: 3.7 mmol/L (ref 3.5–5.1)
SODIUM: 142 mmol/L (ref 135–145)
Sodium: 137 mmol/L (ref 135–145)

## 2015-02-16 LAB — I-STAT TROPONIN, ED: Troponin i, poc: 0 ng/mL (ref 0.00–0.08)

## 2015-02-16 LAB — LIPID PANEL
CHOLESTEROL: 167 mg/dL (ref 0–200)
HDL: 47 mg/dL (ref >40)
LDL Cholesterol: 91 mg/dL (ref 0–99)
Total CHOL/HDL Ratio: 3.6 RATIO
Triglycerides: 146 mg/dL (ref <150)
VLDL: 29 mg/dL (ref 0–40)

## 2015-02-16 LAB — CBC
HEMATOCRIT: 44.3 % (ref 36.0–46.0)
HEMOGLOBIN: 15 g/dL (ref 12.0–15.0)
MCH: 32.1 pg (ref 26.0–34.0)
MCHC: 33.9 g/dL (ref 30.0–36.0)
MCV: 94.9 fL (ref 78.0–100.0)
Platelets: 244 10*3/uL (ref 150–400)
RBC: 4.67 MIL/uL (ref 3.87–5.11)
RDW: 12.9 % (ref 11.5–15.5)
WBC: 8.8 10*3/uL (ref 4.0–10.5)

## 2015-02-16 LAB — TROPONIN I: Troponin I: <0.03 ng/mL (ref <0.031)

## 2015-02-16 MED ORDER — REGADENOSON 0.4 MG/5ML IV SOLN
0.4000 mg | Freq: Once | INTRAVENOUS | Status: AC
  Administered 2015-02-16: 0.4 mg via INTRAVENOUS

## 2015-02-16 MED ORDER — REGADENOSON 0.4 MG/5ML IV SOLN
INTRAVENOUS | Status: AC
  Administered 2015-02-16: 0.4 mg via INTRAVENOUS
  Filled 2015-02-16: qty 5

## 2015-02-16 MED ORDER — TECHNETIUM TC 99M SESTAMIBI GENERIC - CARDIOLITE
30.0000 | Freq: Once | INTRAVENOUS | Status: AC | PRN
  Administered 2015-02-16: 30 via INTRAVENOUS

## 2015-02-16 MED ORDER — NITROGLYCERIN 0.4 MG SL SUBL: 0.4000 mg | SUBLINGUAL_TABLET | SUBLINGUAL | Status: DC | PRN

## 2015-02-16 MED ORDER — PANTOPRAZOLE SODIUM 40 MG PO TBEC: 40.0000 mg | DELAYED_RELEASE_TABLET | Freq: Every day | ORAL | Status: DC

## 2015-02-16 MED ORDER — NITROGLYCERIN 0.4 MG SL SUBL
0.4000 mg | SUBLINGUAL_TABLET | SUBLINGUAL | Status: DC | PRN
Start: 2015-02-16 — End: 2015-02-17

## 2015-02-16 MED ORDER — OXYCODONE-ACETAMINOPHEN 5-325 MG PO TABS
ORAL_TABLET | ORAL | Status: AC
  Filled 2015-02-16: qty 1

## 2015-02-16 MED ORDER — OXYCODONE-ACETAMINOPHEN 5-325 MG PO TABS
1.0000 | ORAL_TABLET | Freq: Once | ORAL | Status: AC
  Administered 2015-02-16: 1 via ORAL

## 2015-02-16 MED ORDER — TECHNETIUM TC 99M SESTAMIBI GENERIC - CARDIOLITE
10.0000 | Freq: Once | INTRAVENOUS | Status: AC | PRN
  Administered 2015-02-16: 10 via INTRAVENOUS

## 2015-02-16 NOTE — Progress Notes (Signed)
Pt DC home. She was given prescriptions, education, and instructions. She says she have an understanding. She was transferred out via [place] accompanied by CNA.

## 2015-02-16 NOTE — Discharge Instructions (Signed)

## 2015-02-16 NOTE — Care Management Note (Signed)
Case Management Note  Patient Details  Name: [name] MRN: [id number] Date of Birth: 1951-07-04  Subjective/Objective:  64 y/o f admitted w/Chest pain. From home.                  Action/Plan:stress test. No anticipated d/c needs.   Expected Discharge Date:  02/16/15               Expected Discharge Plan:  Home/Self Care  In-House Referral:     Discharge planning Services  CM Consult  Post Acute Care Choice:    Choice offered to:     DME Arranged:    DME Agency:     HH Arranged:    HH Agency:     Status of Service:  In process, will continue to follow  Medicare Important Message Given:    Date Medicare IM Given:    Medicare IM give by:    Date Additional Medicare IM Given:    Additional Medicare Important Message give by:     If discussed at [place] of Stay Meetings, dates discussed:    Additional Comments:  Dessa Phi, RN 02/16/2015, 11:09 AM

## 2015-02-16 NOTE — ED Provider Notes (Signed)
CSN: [id number]     Arrival date & time 02/16/15  1944 History   None    Chief Complaint  Patient presents with  . Back Pain  . Chest Pain    (Consider location/radiation/quality/duration/timing/severity/associated sxs/prior Treatment) HPI Patient is a 64 year old Caucasian female with a history of diverticulitis and diabetes presenting today for chest pain and back pain. She reports this started 2 days ago feels like a pressure on her chest and a tightness in her back. Aggravated by eating. Alleviated by nothing. No radiation to the arms. Described as a 10 out of 10 sharp pain whenever it occurs. Patient was recently seen at [facility] and worked up for cardiac etiology of pain. EKGs, troponins, echo and stress test all shown to be negative for cardiac etiology. Patient given Toradol and a GI cocktail with resolution of symptoms. Symptoms continued when arrived home when she attempted to eat something. Came back to the emergency department for further evaluation.  Past Medical History  Diagnosis Date  . Cataract     Bil  . Diverticulosis   . Diabetes mellitus     controlled by diet/ no meds   Past Surgical History  Procedure Laterality Date  . Cataract extraction  2008    Bil  . Tonsillectomy  1957  . Retinal detachment surgery  1991    Bil  . Eye surgery  1991    Detached retina   Family History  Problem Relation Age of Onset  . Arthritis Mother     Osteoarthritis  . Diabetes Mother     Type 2  . Cancer Father     Lung  . Colon cancer Neg Hx   . Stomach cancer Neg Hx   . Rectal cancer Neg Hx    History  Substance Use Topics  . Smoking status: Never Smoker   . Smokeless tobacco: Never Used     Comment: Single-lives alone, 2 dogs  . Alcohol Use: No   OB History    No data available     Review of Systems  Constitutional: Negative for fever and chills.  HENT: Negative for congestion and sore throat.   Eyes: Negative for pain.  Respiratory: Positive  for chest tightness. Negative for cough and shortness of breath.   Cardiovascular: Positive for chest pain. Negative for palpitations.  Gastrointestinal: Negative for nausea, vomiting, abdominal pain and diarrhea.  Genitourinary: Negative for dysuria and flank pain.  Musculoskeletal: Positive for back pain. Negative for neck pain.  Skin: Negative for rash.  Allergic/Immunologic: Negative.   Neurological: Negative for dizziness and light-headedness.  Psychiatric/Behavioral: Negative for confusion.      Allergies  Procaine hcl and Zithromax  Home Medications   Prior to Admission medications   Medication Sig Start Date End Date Taking? Authorizing Provider  aspirin (ASPIRIN EC) 81 MG EC tablet Take 81 mg by mouth daily. Swallow whole.    Historical Provider, MD  Ca Carbonate-Mag Hydroxide (ROLAIDS PO) Take 1 tablet by mouth once.    Historical Provider, MD  pantoprazole (PROTONIX) 40 MG tablet Take 1 tablet (40 mg total) by mouth daily at 6 (six) AM. 02/16/15   Hosie Poisson, MD  Polyethyl Glycol-Propyl Glycol (SYSTANE OP) Apply 1-2 drops to eye daily as needed (dry eyes).    Historical Provider, MD  PROBIOTIC CAPS Take 1 capsule by mouth daily.      Historical Provider, MD   BP 155/101 mmHg  Pulse 81  Temp(Src) 98.7 F (37.1 C) (Oral)  Resp 20  Ht 5\' 8"  (1.727 m)  Wt 246 lb 12.8 oz (111.948 kg)  BMI 37.53 kg/m2  SpO2 92% Physical Exam  Constitutional: She is oriented to person, place, and time. She appears well-developed and well-nourished. No distress.  HENT:  Head: Normocephalic and atraumatic.  Eyes: Conjunctivae and EOM are normal. Pupils are equal, round, and reactive to light.  Neck: Normal range of motion. Neck supple.  Cardiovascular: Normal rate, regular rhythm and normal heart sounds.   Pulses:      Radial pulses are 2+ on the right side, and 2+ on the left side.  Pulmonary/Chest: Effort normal and breath sounds normal. No respiratory distress. She has no decreased  breath sounds. She has no wheezes.  Abdominal: Soft. Bowel sounds are normal. There is no tenderness. There is no rigidity, no rebound, no guarding, no tenderness at McBurney's point and negative Murphy's sign.  Musculoskeletal: Normal range of motion.  Neurological: She is alert and oriented to person, place, and time. She has normal reflexes. No cranial nerve deficit.  Skin: Skin is warm and dry. She is not diaphoretic.  Psychiatric: She has a normal mood and affect.    ED Course  Procedures (including critical care time) Labs Review Labs Reviewed  BASIC METABOLIC PANEL - Abnormal; Notable for the following:    Glucose, Bld 165 (*)    Creatinine, Ser 1.19 (*)    GFR calc non Af Amer 47 (*)    GFR calc Af Amer 55 (*)    All other components within normal limits  CBC  I-STAT TROPOININ, ED    Imaging Review Dg Chest 2 View  02/15/2015   CLINICAL DATA:  Acute chest pain today.  EXAM: CHEST  2 VIEW  COMPARISON:  01/17/2015 chest radiograph  FINDINGS: The cardiomediastinal silhouette is unremarkable.  Minimal bibasilar atelectasis noted.  There is no evidence of focal airspace disease, pulmonary edema, suspicious pulmonary nodule/mass, pleural effusion, or pneumothorax. No acute bony abnormalities are identified.  IMPRESSION: Minimal bibasilar atelectasis without other significant abnormality.   Electronically Signed   By: Margarette Canada M.D.   On: 02/15/2015 11:58   Nm Myocar Multi W/spect W/wall Motion / Ef  02/16/2015   CLINICAL DATA:  64 year old with chest pain.  EXAM: MYOCARDIAL IMAGING WITH SPECT (REST AND PHARMACOLOGIC-STRESS)  GATED LEFT VENTRICULAR WALL MOTION STUDY  LEFT VENTRICULAR EJECTION FRACTION  TECHNIQUE: Standard myocardial SPECT imaging was performed after resting intravenous injection of 10.9 mCi Tc-39m sestamibi. Subsequently, intravenous infusion of Lexiscan was performed under the supervision of the Cardiology staff. At peak effect of the drug, 32.4 mCi Tc-68m sestamibi  was injected intravenously and standard myocardial SPECT imaging was performed. Quantitative gated imaging was also performed to evaluate left ventricular wall motion, and estimate left ventricular ejection fraction.  COMPARISON:  None.  FINDINGS: Perfusion: No decreased activity in the left ventricle on stress imaging to suggest reversible ischemia or infarction.  Wall Motion: Normal left ventricular wall motion. No left ventricular dilation.  Left Ventricular Ejection Fraction: 59 %  End diastolic volume 62 ml  End systolic volume 25 ml  IMPRESSION: 1. No reversible ischemia or infarction.  2. Normal left ventricular wall motion.  3. Left ventricular ejection fraction is 59%.  4. Low-risk stress test findings*.  *2012 Appropriate Use Criteria for Coronary Revascularization Focused Update: J Am Coll Cardiol. 3300;76(2):263-335. http://content.airportbarriers.com.aspx?articleid=1201161   Electronically Signed   By: Markus Daft M.D.   On: 02/16/2015 13:36     EKG Interpretation None  MDM   Final diagnoses:  None   On initial evaluation the patient is hemodynamically stable and in no acute distress. No continued pain on arrival. EKG with no acute ischemic changes, arrhythmia, arrhythmic genic potential. Initial troponin negative. Patient refuses chest x-ray due to having chest x-ray this morning. Patient had an echo as well as a stress test performed this morning. Strongly doubt cardiac etiology of pain at this time. Patient wells low risk and doubt PE. Patient concerned for possible gall bladder pathology. Bedside ultrasound performed showing only mild sludge in the gallbladder. Common bile duct only mildly increased in size for age. Doubt symptomatically cholelithiasis or cholecystitis at this time. Abdominal aorta also examined showing no acute aneurysm or free fluid. Symptoms occurring only when eating likely esophageal/GI in nature. Possible esophageal spasms. Patient prescribed nitroglycerin  sublingual tablets to take when pain is present to find if any alleviation occurs. Patient with GI physician and advised to follow-up with them. Also advised to follow-up with PCP as soon as possible.  If performed, labs, EKGs, and imaging were reviewed/interpreted by myself and my attending and incorporated into medical decision making.  Discussed pertinent finding with patient or caregiver prior to discharge with no further questions.  Immediate return precautions given and pt or caregiver reports understanding.  Pt care supervised by my attending Dr. Loistine Chance, MD PGY-2  Emergency Medicine     Geronimo Boot, MD 02/17/15 [place], MD 02/18/15 [phone]

## 2015-02-16 NOTE — ED Notes (Signed)
Pt is chest pain free at time, and has been since her arrival to [facility] ED.

## 2015-02-16 NOTE — ED Notes (Signed)
Pt was d/c from [place] today for same. She was back pain since Sunday in between her shoulder blades and causing chest pain. Pt had toradol and GI cocktail in the ER and pain went away. Had cardiac work up and all was negative. But feels like it might be her gallbladder.

## 2015-02-16 NOTE — ED Notes (Signed)
MD at bedside. 

## 2015-02-16 NOTE — ED Notes (Signed)
Pt left with all belongings and refused wheelchair. 

## 2015-02-16 NOTE — Progress Notes (Addendum)
     Patient Name: [name] Date of Encounter: 02/16/2015  Active Problems:   Chest pain   Primary Cardiologist: None  Patient Profile: 64 yo female w/ hx hyperglycemia, borderline DM, spinal stenosis, obesity, admitted 07/10 w/ chest pain. Ez neg MI. MV requested.  SUBJECTIVE: No chest pain or SOB.  OBJECTIVE Filed Vitals:   02/15/15 1700 02/15/15 1708 02/15/15 2230 02/16/15 0553  BP:  149/80 137/60 134/75  Pulse:  70 55 69  Temp:  98.1 F (36.7 C) 97.5 F (36.4 C) 97.7 F (36.5 C)  TempSrc:  Oral Axillary Oral  Resp:  16 18 18   Height: 5\' 8"  (1.727 m)     Weight: 246 lb 12.8 oz (111.948 kg)     SpO2:  95% 97% 95%    Intake/Output Summary (Last 24 hours) at 02/16/15 1056 Last data filed at 02/16/15 0558  Gross per 24 hour  Intake 1891.25 ml  Output   1250 ml  Net 641.25 ml   Filed Weights   02/15/15 1700  Weight: 246 lb 12.8 oz (111.948 kg)   LABS: CBC: Recent Labs  02/15/15 1101  WBC 10.4  HGB 15.2*  HCT 46.4*  MCV 96.1  PLT 836   Basic Metabolic Panel: Recent Labs  02/15/15 1101 02/16/15 0530  NA 139 137  K 4.0 3.6  CL 106 107  CO2 24 24  GLUCOSE 160* 153*  BUN 16 16  CREATININE 0.92 1.08*  CALCIUM 9.2 8.5*   Liver Function Tests: Recent Labs  02/15/15 1110  AST 48*  ALT 33  ALKPHOS 78  BILITOT 0.9  PROT 7.1  ALBUMIN 4.0   Cardiac Enzymes: Recent Labs  02/15/15 1732 02/15/15 2335 02/16/15 0530  TROPONINI <0.03 <0.03 <0.03    Recent Labs  02/15/15 1110  TROPIPOC 0.00   Fasting Lipid Panel: Recent Labs  02/16/15 0530  CHOL 167  HDL 47  LDLCALC 91  TRIG 146  CHOLHDL 3.6   Radiology/Studies: Dg Chest 2 View 02/15/2015   CLINICAL DATA:  Acute chest pain today.  EXAM: CHEST  2 VIEW  COMPARISON:  01/17/2015 chest radiograph  FINDINGS: The cardiomediastinal silhouette is unremarkable.  Minimal bibasilar atelectasis noted.  There is no evidence of focal airspace disease, pulmonary edema, suspicious pulmonary  nodule/mass, pleural effusion, or pneumothorax. No acute bony abnormalities are identified.  IMPRESSION: Minimal bibasilar atelectasis without other significant abnormality.   Electronically Signed   By: Margarette Canada M.D.   On: 02/15/2015 11:58   Current Medications:  . aspirin EC  81 mg Oral Daily  . enoxaparin (LOVENOX) injection  0.5 mg/kg Subcutaneous Q24H  . pantoprazole  40 mg Oral Q0600   . sodium chloride 75 mL/hr at 02/15/15 1635    ASSESSMENT AND PLAN: Active Problems:   Chest pain -ez neg MI, ECG not acute, for MV today.  Jonetta Speak , PA-C 10:56 AM 02/16/2015

## 2015-02-16 NOTE — Care Management Note (Signed)
Case Management Note  Patient Details  Name: JHAYLA PODGORSKI MRN: [id number] Date of Birth: [birth year]  Subjective/Objective:                    Action/Plan:d/c home no needs or orders.   Expected Discharge Date:  02/16/15               Expected Discharge Plan:  Home/Self Care  In-House Referral:     Discharge planning Services  CM Consult  Post Acute Care Choice:    Choice offered to:     DME Arranged:    DME Agency:     HH Arranged:    HH Agency:     Status of Service:  Completed, signed off  Medicare Important Message Given:    Date Medicare IM Given:    Medicare IM give by:    Date Additional Medicare IM Given:    Additional Medicare Important Message give by:     If discussed at [place] of Stay Meetings, dates discussed:    Additional Comments:  Dessa Phi, RN 02/16/2015, 3:40 PM

## 2015-02-16 NOTE — Progress Notes (Signed)
Lexiscan MV performed (no GXT 2nd bad knees and hx spinal stenosis).   1 day study, Radiologist to read.  Lenoard Aden 02/16/2015 12:17 PM Beeper [phone]

## 2015-02-16 NOTE — Discharge Summary (Signed)
Physician Discharge Summary  [name] KPQ:[id number] DOB: 03/13/1951 DOA: 02/15/2015  PCP: Gwendolyn Grant, MD  Admit date: 02/15/2015 Discharge date: 02/16/2015  Time spent: 30 minutes  Recommendations for Outpatient Follow-up:  1. Follow up with GI as needed.   Discharge Diagnoses:  Active Problems:   Chest pain atypical chest pain.  Heart burn.   Discharge Condition: improved.   Diet recommendation: regular diet.   Filed Weights   02/15/15 1700  Weight: 111.948 kg (246 lb 12.8 oz)    History of present illness:  [name] is a 64 y.o. female with no prior medical history, came in for intermittent chest pressures and some aching , cramping pain int he shoulder blades over the last 2 weeks.  Hospital Course: Atypical chest pain: She was admitted to telemetry and ACS ruled out.  myoview is negative for inducible ischemia.  Her pain resolved the next day.  She was discharged on oral PPI and recommended to follow up with GI as needed.   Procedures:  myoview   Consultations:  none  Discharge Exam: Filed Vitals:   02/16/15 1401  BP: 120/74  Pulse: 70  Temp: 97.6 F (36.4 C)  Resp: 18    General: alert afebrile comfortable.  Cardiovascular: s1s2 Respiratory: ctab.   Discharge Instructions   Discharge Instructions    Discharge instructions    Complete by:  As directed   Follow up with PCP in one week. Post hospitalization office visit.          Current Discharge Medication List    START taking these medications   Details  pantoprazole (PROTONIX) 40 MG tablet Take 1 tablet (40 mg total) by mouth daily at 6 (six) AM. Qty: 30 tablet, Refills: 0      CONTINUE these medications which have NOT CHANGED   Details  aspirin (ASPIRIN EC) 81 MG EC tablet Take 81 mg by mouth daily. Swallow whole.    Ca Carbonate-Mag Hydroxide (ROLAIDS PO) Take 1 tablet by mouth once.    Polyethyl Glycol-Propyl Glycol (SYSTANE OP) Apply 1-2 drops to eye daily as  needed (dry eyes).    PROBIOTIC CAPS Take 1 capsule by mouth daily.        STOP taking these medications     MOVIPREP 100 G SOLR        Allergies  Allergen Reactions  . Procaine Hcl Other (See Comments)    Novacaine-unknown side effects as child  . Zithromax [Azithromycin Dihydrate] Other (See Comments)    Numbness body   Follow-up Information    Follow up with Gwendolyn Grant, MD In 1 week.   Specialty:  Internal Medicine   Why:  post hospitalization visit.    Contact information:   [address] [place] [address] [phone]        The results of significant diagnostics from this hospitalization (including imaging, microbiology, ancillary and laboratory) are listed below for reference.    Significant Diagnostic Studies: Dg Chest 2 View  02/15/2015   CLINICAL DATA:  Acute chest pain today.  EXAM: CHEST  2 VIEW  COMPARISON:  01/17/2015 chest radiograph  FINDINGS: The cardiomediastinal silhouette is unremarkable.  Minimal bibasilar atelectasis noted.  There is no evidence of focal airspace disease, pulmonary edema, suspicious pulmonary nodule/mass, pleural effusion, or pneumothorax. No acute bony abnormalities are identified.  IMPRESSION: Minimal bibasilar atelectasis without other significant abnormality.   Electronically Signed   By: Cleatis Polka.D.  On: 02/15/2015 11:58   Nm Myocar Multi W/spect W/wall Motion / Ef  02/16/2015   CLINICAL DATA:  64 year old with chest pain.  EXAM: MYOCARDIAL IMAGING WITH SPECT (REST AND PHARMACOLOGIC-STRESS)  GATED LEFT VENTRICULAR WALL MOTION STUDY  LEFT VENTRICULAR EJECTION FRACTION  TECHNIQUE: Standard myocardial SPECT imaging was performed after resting intravenous injection of 10.9 mCi Tc-45m sestamibi. Subsequently, intravenous infusion of Lexiscan was performed under the supervision of the Cardiology staff. At peak effect of the drug, 32.4 mCi Tc-82m sestamibi was injected intravenously and standard  myocardial SPECT imaging was performed. Quantitative gated imaging was also performed to evaluate left ventricular wall motion, and estimate left ventricular ejection fraction.  COMPARISON:  None.  FINDINGS: Perfusion: No decreased activity in the left ventricle on stress imaging to suggest reversible ischemia or infarction.  Wall Motion: Normal left ventricular wall motion. No left ventricular dilation.  Left Ventricular Ejection Fraction: 59 %  End diastolic volume 62 ml  End systolic volume 25 ml  IMPRESSION: 1. No reversible ischemia or infarction.  2. Normal left ventricular wall motion.  3. Left ventricular ejection fraction is 59%.  4. Low-risk stress test findings*.  *2012 Appropriate Use Criteria for Coronary Revascularization Focused Update: J Am Coll Cardiol. 6378;58(8):502-774. http://content.airportbarriers.com.aspx?articleid=1201161   Electronically Signed   By: Markus Daft M.D.   On: 02/16/2015 13:36    Microbiology: No results found for this or any previous visit (from the past 240 hour(s)).   Labs: Basic Metabolic Panel:  Recent Labs Lab 02/15/15 1101 02/16/15 0530  NA 139 137  K 4.0 3.6  CL 106 107  CO2 24 24  GLUCOSE 160* 153*  BUN 16 16  CREATININE 0.92 1.08*  CALCIUM 9.2 8.5*   Liver Function Tests:  Recent Labs Lab 02/15/15 1110  AST 48*  ALT 33  ALKPHOS 78  BILITOT 0.9  PROT 7.1  ALBUMIN 4.0    Recent Labs Lab 02/15/15 1110  LIPASE 19*   No results for input(s): AMMONIA in the last 168 hours. CBC:  Recent Labs Lab 02/15/15 1101  WBC 10.4  HGB 15.2*  HCT 46.4*  MCV 96.1  PLT 250   Cardiac Enzymes:  Recent Labs Lab 02/15/15 1732 02/15/15 2335 02/16/15 0530  TROPONINI <0.03 <0.03 <0.03   BNP: BNP (last 3 results) No results for input(s): BNP in the last 8760 hours.  ProBNP (last 3 results) No results for input(s): PROBNP in the last 8760 hours.  CBG: No results for input(s): GLUCAP in the last 168  hours.     SignedHosie Poisson  [facility] 02/16/2015, 3:37 PM

## 2015-02-20 ENCOUNTER — Encounter: Payer: Self-pay | Admitting: Internal Medicine

## 2015-02-20 ENCOUNTER — Ambulatory Visit (INDEPENDENT_AMBULATORY_CARE_PROVIDER_SITE_OTHER): Payer: 59 | Admitting: Internal Medicine

## 2015-02-20 VITALS — BP 118/68 | HR 69 | Temp 97.6°F | Resp 16 | Wt 245.0 lb

## 2015-02-20 DIAGNOSIS — R1011 Right upper quadrant pain: Secondary | ICD-10-CM | POA: Diagnosis not present

## 2015-02-20 DIAGNOSIS — R0789 Other chest pain: Secondary | ICD-10-CM

## 2015-02-20 DIAGNOSIS — K828 Other specified diseases of gallbladder: Secondary | ICD-10-CM | POA: Diagnosis not present

## 2015-02-20 NOTE — Patient Instructions (Signed)
Reflux of gastric acid may be asymptomatic as this may occur mainly during sleep.The triggers for reflux  include stress; the "aspirin family" ; alcohol; peppermint; and caffeine (coffee, tea, cola, and chocolate). The aspirin family would include aspirin and the nonsteroidal agents such as ibuprofen &  Naproxen. Tylenol would not cause reflux. If having symptoms ; food & drink should be avoided for @ least 2 hours before going to bed.  

## 2015-02-20 NOTE — Progress Notes (Signed)
Pre visit review using our clinic review tool, if applicable. No additional management support is needed unless otherwise documented below in the visit note. 

## 2015-02-20 NOTE — Progress Notes (Signed)
   Subjective:    Patient ID: [name], female    DOB: 25-May-1951, 64 y.o.   MRN: [id number]  HPI  She was hospitalized 7/10-7/11/16 with atypical chest pain. It was described as substernal pressure at rest associated with diaphoresis. It lasted 2.5 hours. The evening of discharge she went back to the emergency room with recurrent pain. Within 15 minutes of being in the ER it had resolved.  Her troponins serially were negative. Of significance she had eaten a Reuben sandwich at 3 PM the afternoon of discharge. At the hospital she had a nuclear stress test which was negative. She also had a portable ultrasound of the gallbladder which revealed sludge. At the hospital glucoses were 153 & 165 but her A1c was 6.1%. Her lipids revealed an HDL 47 and LDL 91.   Prior to the initial admission she had been eating fried foods and red meat. She does not exercise.  Her great-grandmother had diabetes. Maternal grandfather had heart attack at 53.  Colonoscopy in January this year revealed 3 polyps, 2 of which were adenomas.  Review of Systems She denies coke colored urine or clay colored stool. Unexplained weight loss, significant dyspepsia, dysphagia, melena, rectal bleeding, or persistently small caliber stools are denied.    Objective:   Physical Exam  Pertinent or positive findings include: Abdomen is protuberant but nontender. She has crepitus of the knees.The deep tendon reflexes are 0-1/2+ at the knees.  General appearance :adequately nourished; in no distress.  Eyes: No conjunctival inflammation or scleral icterus is present.  Oral exam:  Lips and gums are healthy appearing.There is no oropharyngeal erythema or exudate noted. Dental hygiene is good.  Heart:  Normal rate and regular rhythm. S1 and S2 normal without gallop, murmur, click, rub or other extra sounds    Lungs:Chest clear to auscultation; no wheezes, rhonchi,rales ,or rubs present.No increased work of breathing.    Abdomen: bowel sounds normal, soft and non-tender without masses, organomegaly or hernias noted.  No guarding or rebound.   Vascular : all pulses equal ; no bruits present.  Skin:Warm & dry.  Intact without suspicious lesions or rashes ; no tenting or jaundice   Lymphatic: No lymphadenopathy is noted about the head, neck, axilla.   Neuro: Strength, tone  normal.       Assessment & Plan:  #1 atypical chest pain at rest with negative nuclear stress test  #2 gallbladder sludge on portable ultrasound  Plan: She is to continue antireflux measures and the PPI. Referral will be made to her Gastrologist for further evaluation.

## 2015-02-25 ENCOUNTER — Encounter: Payer: Self-pay | Admitting: Internal Medicine

## 2015-02-25 ENCOUNTER — Ambulatory Visit (INDEPENDENT_AMBULATORY_CARE_PROVIDER_SITE_OTHER): Payer: 59 | Admitting: Internal Medicine

## 2015-02-25 VITALS — BP 122/78 | HR 80 | Wt 245.8 lb

## 2015-02-25 DIAGNOSIS — R1013 Epigastric pain: Secondary | ICD-10-CM | POA: Diagnosis not present

## 2015-02-25 DIAGNOSIS — R0789 Other chest pain: Secondary | ICD-10-CM

## 2015-02-25 NOTE — Patient Instructions (Addendum)
You have been scheduled for an abdominal ultrasound at [facility] (1st floor of hospital) on 03/04/2015 at 8am. Please arrive 15 minutes prior to your appointment for registration. Make certain not to have anything to eat or drink 6 hours prior to your appointment. Should you need to reschedule your appointment, please contact radiology at [phone]. This test typically takes about 30 minutes to perform.  Use Protonix for 2 weeks then stop  Low-Fat Diet for  Gallbladder Conditions A low-fat diet can be helpful if you have pancreatitis or a gallbladder condition. With these conditions, your pancreas and gallbladder have trouble digesting fats. A healthy eating plan with less fat will help rest your pancreas and gallbladder and reduce your symptoms. WHAT DO I NEED TO KNOW ABOUT THIS DIET?  Eat a low-fat diet.  Reduce your fat intake to less than 20-30% of your total daily calories. This is less than 50-60 g of fat per day.  Remember that you need some fat in your diet. Ask your dietician what your daily goal should be.  Choose nonfat and low-fat healthy foods. Look for the words "nonfat," "low fat," or "fat free."  As a guide, look on the label and choose foods with less than 3 g of fat per serving. Eat only one serving.  Avoid alcohol.  Do not smoke. If you need help quitting, talk with your health care provider.  Eat small frequent meals instead of three large heavy meals. WHAT FOODS CAN I EAT? Grains Include healthy grains and starches such as potatoes, wheat bread, fiber-rich cereal, and brown rice. Choose whole grain options whenever possible. In adults, whole grains should account for 45-65% of your daily calories.    Use Protonix for 2 weeks the stiop  Fruits and Vegetables Eat plenty of fruits and vegetables. Fresh fruits and vegetables add fiber to your diet. Meats and Other Protein Sources Eat lean meat such as chicken and pork. Trim any fat off of meat before  cooking it. Eggs, fish, and beans are other sources of protein. In adults, these foods should account for 10-35% of your daily calories. Dairy Choose low-fat milk and dairy options. Dairy includes fat and protein, as well as calcium.  Fats and Oils Limit high-fat foods such as fried foods, sweets, baked goods, sugary drinks.  Other Creamy sauces and condiments, such as mayonnaise, can add extra fat. Think about whether or not you need to use them, or use smaller amounts or low fat options. WHAT FOODS ARE NOT RECOMMENDED?  High fat foods, such as:  [employer].  Ice cream.  [place] toast.  Sweet rolls.  Pizza.  Cheese bread.  Foods covered with batter, butter, creamy sauces, or cheese.  Fried foods.  Sugary drinks and desserts.  Foods that cause gas or bloating Document Released: 07/30/2013 Document Reviewed: 07/30/2013 [facility] Patient Information 2015 [place], [place]. This information is not intended to replace advice given to you by your health care provider. Make sure you discuss any questions you have with your health care provider.

## 2015-02-25 NOTE — Progress Notes (Signed)
Subjective:    Patient ID: [name], female    DOB: 01-May-1951, 64 y.o.   MRN: [id number]  HPI [name] is a 64 year old female with a past medical history of adenomatous colon polyps, diverticulosis, and diabetes who is seen in follow-up to evaluate atypical chest pain. She is here alone today. 2 weeks ago on a Sunday she developed upper abdominal versus lower chest pressure radiating to between her shoulder blades. This was associated with diaphoresis. Started at rest. Made her very nervous and she went to the ER. She was admitted where myocardial infarction was excluded. Stress test was performed the next day and was negative than to be very low risk. She was discharged, went home ate a reuben sandwich and 90 minutes later developed the same chest pressure. She went back to the ER and was told she may have gallbladder disease. She reports the ER doctor performed a bedside ultrasound and suggested gallbladder sludge. Pantoprazole 40 mg was started and she was discharged in advised to follow-up with me. No further symptoms at all since going back to the ER. She's been taking pantoprazole 40 mg daily. Prior to starting this she was having frequent indigestion but not so much heartburn. No dysphagia or odynophagia. No nausea or vomiting. Good appetite. No weight loss. No change in bowel habit including no rectal bleeding or melena  Earlier this year she had a follow-up surveillance colonoscopy which revealed 3 colonic polyps, 2 of which were precancerous. One adenoma, one sessile serrated polyp and one hyperplastic polyp. There was mild diverticulosis noted in the ascending colon, transverse colon and descending colon  Current Medications, Allergies, Past Medical History, Past Surgical History, Family History and Social History were reviewed in [employer] record.  Review of Systems As per HPI, otherwise negative     Objective:   Physical Exam BP 122/78 mmHg  Pulse  80  Wt 245 lb 12.8 oz (111.494 kg) Constitutional: Well-developed and well-nourished. No distress. HEENT: Normocephalic and atraumatic. Oropharynx is clear and moist. No oropharyngeal exudate. Conjunctivae are normal.  No scleral icterus. Neck: Neck supple. Trachea midline. Cardiovascular: Normal rate, regular rhythm and intact distal pulses. No M/R/G Pulmonary/chest: Effort normal and breath sounds normal. No wheezing, rales or rhonchi. Abdominal: Soft, nontender, nondistended. Bowel sounds active throughout. There are no masses palpable. No hepatosplenomegaly. Extremities: no clubbing, cyanosis, or edema Lymphadenopathy: No cervical adenopathy noted. Neurological: Alert and oriented to person place and time. Skin: Skin is warm and dry. No rashes noted. Psychiatric: Normal mood and affect. Behavior is normal.  CBC    Component Value Date/Time   WBC 8.8 02/16/2015 2002   RBC 4.67 02/16/2015 2002   HGB 15.0 02/16/2015 2002   HCT 44.3 02/16/2015 2002   PLT 244 02/16/2015 2002   MCV 94.9 02/16/2015 2002   MCH 32.1 02/16/2015 2002   MCHC 33.9 02/16/2015 2002   RDW 12.9 02/16/2015 2002   LYMPHSABS 1.7 01/17/2015 1145   MONOABS 0.4 01/17/2015 1145   EOSABS 0.1 01/17/2015 1145   BASOSABS 0.0 01/17/2015 1145    CMP     Component Value Date/Time   NA 142 02/16/2015 2002   K 3.7 02/16/2015 2002   CL 109 02/16/2015 2002   CO2 23 02/16/2015 2002   GLUCOSE 165* 02/16/2015 2002   BUN 12 02/16/2015 2002   CREATININE 1.19* 02/16/2015 2002   CALCIUM 9.0 02/16/2015 2002   PROT 7.1 02/15/2015 1110   ALBUMIN 4.0 02/15/2015 1110   AST  48* 02/15/2015 1110   ALT 33 02/15/2015 1110   ALKPHOS 78 02/15/2015 1110   BILITOT 0.9 02/15/2015 1110   GFRNONAA 47* 02/16/2015 2002   GFRAA 97* 02/16/2015 2002   Colonoscopy and path report reviewed including with the patient     Assessment & Plan:   64 year old female with a past medical history of adenomatous colon polyps, diverticulosis, and  diabetes who is seen in follow-up to evaluate atypical chest pain.   1. Atypical chest pain/upper abdominal pain -- differential includes gallbladder disease/biliary dyskinesia, esophageal spasm, or uncontrolled GERD.  No symptoms while on pantoprazole arguing for possible GERD versus esophageal spasm. We discussed how PPI would not improve gallbladder symptoms. I recommended 2 weeks of PPI therapy and then discontinuation. Low-fat diet. Abdominal ultrasound to rule out gallstones and sludge. If negative and symptoms return may need HIDA scan. Follow me in 3 months, sooner if necessary. If symptoms return after PPI discontinuation we will likely prolong PPI therapy.  2. History of adenomatous colon polyps -- repeat colonoscopy 2021

## 2015-03-04 ENCOUNTER — Ambulatory Visit (HOSPITAL_COMMUNITY)
Admission: RE | Admit: 2015-03-04 | Discharge: 2015-03-04 | Disposition: A | Discharge status: Home / Self Care | Payer: 59 | Source: Ambulatory Visit | Attending: Internal Medicine | Admitting: Internal Medicine

## 2015-03-04 DIAGNOSIS — K8020 Calculus of gallbladder without cholecystitis without obstruction: Secondary | ICD-10-CM | POA: Insufficient documentation

## 2015-03-04 DIAGNOSIS — R1013 Epigastric pain: Secondary | ICD-10-CM

## 2015-03-04 DIAGNOSIS — R0789 Other chest pain: Secondary | ICD-10-CM | POA: Diagnosis present

## 2015-03-05 ENCOUNTER — Telehealth: Payer: Self-pay | Admitting: Internal Medicine

## 2015-03-05 NOTE — Telephone Encounter (Signed)
Pt states she would like to see a surgeon for possible gallbladder surgery. Pt requests to see Dr. Excell Seltzer. Pt scheduled to see Dr. Excell Seltzer 04/02/15@9 :15am, pt to arrive there at 8:40am. Pt aware of appt. (see abd [place] result)

## 2015-03-12 ENCOUNTER — Telehealth: Payer: Self-pay | Admitting: Internal Medicine

## 2015-03-12 NOTE — Telephone Encounter (Signed)
Pt states since she had been off of protonix she has not had any more pain. Reports she has however had heartburn. Discussed with pt that she could try zantac otc for the heartburn and to call us back if the pain returned. Pt aware and states that she may just take Rolaids.

## 2015-04-15 ENCOUNTER — Encounter (HOSPITAL_COMMUNITY): Payer: Self-pay

## 2015-04-15 ENCOUNTER — Other Ambulatory Visit: Payer: Self-pay | Admitting: General Surgery

## 2015-04-15 ENCOUNTER — Encounter (HOSPITAL_COMMUNITY)
Admission: RE | Admit: 2015-04-15 | Discharge: 2015-04-15 | Disposition: A | Discharge status: Home / Self Care | Payer: 59 | Source: Ambulatory Visit | Attending: General Surgery | Admitting: General Surgery

## 2015-04-15 DIAGNOSIS — Z01812 Encounter for preprocedural laboratory examination: Secondary | ICD-10-CM | POA: Insufficient documentation

## 2015-04-15 DIAGNOSIS — K8020 Calculus of gallbladder without cholecystitis without obstruction: Secondary | ICD-10-CM | POA: Diagnosis not present

## 2015-04-15 DIAGNOSIS — K8010 Calculus of gallbladder with chronic cholecystitis without obstruction: Secondary | ICD-10-CM | POA: Diagnosis present

## 2015-04-15 DIAGNOSIS — E119 Type 2 diabetes mellitus without complications: Secondary | ICD-10-CM | POA: Diagnosis not present

## 2015-04-15 DIAGNOSIS — Z6835 Body mass index (BMI) 35.0-35.9, adult: Secondary | ICD-10-CM | POA: Diagnosis not present

## 2015-04-15 HISTORY — DX: Personal history of pneumonia (recurrent): Z87.01

## 2015-04-15 LAB — CBC
HCT: 45 % (ref 36.0–46.0)
HEMOGLOBIN: 15.1 g/dL — AB (ref 12.0–15.0)
MCH: 32.5 pg (ref 26.0–34.0)
MCHC: 33.6 g/dL (ref 30.0–36.0)
MCV: 97 fL (ref 78.0–100.0)
Platelets: 229 10*3/uL (ref 150–400)
RBC: 4.64 MIL/uL (ref 3.87–5.11)
RDW: 12.7 % (ref 11.5–15.5)
WBC: 5.9 10*3/uL (ref 4.0–10.5)

## 2015-04-15 LAB — BASIC METABOLIC PANEL
ANION GAP: 7 (ref 5–15)
BUN: 14 mg/dL (ref 6–20)
CALCIUM: 9 mg/dL (ref 8.9–10.3)
CO2: 23 mmol/L (ref 22–32)
Chloride: 110 mmol/L (ref 101–111)
Creatinine, Ser: 0.96 mg/dL (ref 0.44–1.00)
GFR calc Af Amer: >60 mL/min (ref >60)
Glucose, Bld: 107 mg/dL — ABNORMAL HIGH (ref 65–99)
Potassium: 4 mmol/L (ref 3.5–5.1)
SODIUM: 140 mmol/L (ref 135–145)

## 2015-04-15 LAB — GLUCOSE, CAPILLARY: GLUCOSE-CAPILLARY: 104 mg/dL — AB (ref 65–99)

## 2015-04-15 NOTE — Progress Notes (Signed)
Patient denies chest pain / Shob. Reports recent cardiac test due to when this started they thought it may be her heart. Cardiac test are noted in epci. PCP [facility] Dr. Asa Lente.

## 2015-04-15 NOTE — Pre-Procedure Instructions (Signed)
[name]  04/15/2015      Your procedure is scheduled on September 13  Report to [facility] Admitting at 5:30 A.M.  Call this number if you have problems the morning of surgery:  [phone]   Remember:  Do not eat food or drink liquids after midnight.  Take these medicines the morning of surgery with A SIP OF WATER None   STOP/ Do not take Aspirin, Aleve, Naproxen, Advil, Ibuprofen, Motrin, Vitamins, Herbs, or Supplements starting today   Do not wear jewelry, make-up or nail polish.  Do not wear lotions, powders, or perfumes.  You may wear deodorant.  Do not shave 48 hours prior to surgery.  Men may shave face and neck.  Do not bring valuables to the hospital.  [facility] is not responsible for any belongings or valuables.  Contacts, dentures or bridgework may not be worn into surgery.  Leave your suitcase in the car.  After surgery it may be brought to your room.  For patients admitted to the hospital, discharge time will be determined by your treatment team.  Patients discharged the day of surgery will not be allowed to drive home.   [facility] - Preparing for Surgery  Before surgery, you can play an important role.  Because skin is not sterile, your skin needs to be as free of germs as possible.  You can reduce the number of germs on you skin by washing with CHG (chlorahexidine gluconate) soap before surgery.  CHG is an antiseptic cleaner which kills germs and bonds with the skin to continue killing germs even after washing.  Please DO NOT use if you have an allergy to CHG or antibacterial soaps.  If your skin becomes reddened/irritated stop using the CHG and inform your nurse when you arrive at Short Stay.  Do not shave (including legs and underarms) for at least 48 hours prior to the first CHG shower.  You may shave your face.  Please follow these instructions carefully:   1.  Shower with CHG Soap the night before surgery and the morning of  Surgery.  2.  If you choose to wash your hair, wash your hair first as usual with your normal shampoo.  3.  After you shampoo, rinse your hair and body thoroughly to remove the shampoo.  4.  Use CHG as you would any other liquid soap.  You can apply CHG directly to the skin and wash gently with scrungie or a clean washcloth.  5.  Apply the CHG Soap to your body ONLY FROM THE NECK DOWN.  Do not use on open wounds or open sores.  Avoid contact with your eyes, ears, mouth and genitals (private parts).  Wash genitals (private parts) with your normal soap.  6.  Wash thoroughly, paying special attention to the area where your surgery will be performed.  7.  Thoroughly rinse your body with warm water from the neck down.  8.  DO NOT shower/wash with your normal soap after using and rinsing off the CHG Soap.  9.  Pat yourself dry with a clean towel.            10.  Wear clean pajamas.            11.  Place clean sheets on your bed the night of your first shower and do not sleep with pets.  Day of Surgery  Do not apply any lotions the morning of surgery.  Please wear clean  clothes to the hospital/surgery center.  Please read over the following fact sheets that you were given. Pain Booklet, Coughing and Deep Breathing and Surgical Site Infection Prevention

## 2015-04-16 LAB — HEMOGLOBIN A1C
HEMOGLOBIN A1C: 6 % — AB (ref 4.8–5.6)
MEAN PLASMA GLUCOSE: 126 mg/dL

## 2015-04-21 ENCOUNTER — Ambulatory Visit (HOSPITAL_COMMUNITY): Payer: 59

## 2015-04-21 ENCOUNTER — Ambulatory Visit (HOSPITAL_COMMUNITY): Payer: 59 | Admitting: Anesthesiology

## 2015-04-21 ENCOUNTER — Encounter (HOSPITAL_COMMUNITY): Payer: Self-pay | Admitting: *Deleted

## 2015-04-21 ENCOUNTER — Observation Stay (HOSPITAL_COMMUNITY)
Admission: RE | Admit: 2015-04-21 | Discharge: 2015-04-22 | Disposition: A | Discharge status: Home / Self Care | Payer: 59 | Source: Ambulatory Visit | Attending: General Surgery | Admitting: General Surgery

## 2015-04-21 ENCOUNTER — Encounter (HOSPITAL_COMMUNITY)
Admission: RE | Disposition: A | Discharge status: Home / Self Care | Payer: Self-pay | Source: Ambulatory Visit | Attending: General Surgery

## 2015-04-21 DIAGNOSIS — K8010 Calculus of gallbladder with chronic cholecystitis without obstruction: Secondary | ICD-10-CM | POA: Diagnosis not present

## 2015-04-21 DIAGNOSIS — Z6835 Body mass index (BMI) 35.0-35.9, adult: Secondary | ICD-10-CM | POA: Insufficient documentation

## 2015-04-21 DIAGNOSIS — K8020 Calculus of gallbladder without cholecystitis without obstruction: Secondary | ICD-10-CM

## 2015-04-21 DIAGNOSIS — E119 Type 2 diabetes mellitus without complications: Secondary | ICD-10-CM | POA: Insufficient documentation

## 2015-04-21 HISTORY — PX: CHOLECYSTECTOMY: SHX55

## 2015-04-21 LAB — GLUCOSE, CAPILLARY
GLUCOSE-CAPILLARY: 126 mg/dL — AB (ref 65–99)
Glucose-Capillary: 101 mg/dL — ABNORMAL HIGH (ref 65–99)

## 2015-04-21 SURGERY — LAPAROSCOPIC CHOLECYSTECTOMY WITH INTRAOPERATIVE CHOLANGIOGRAM
Anesthesia: General | Site: Abdomen

## 2015-04-21 MED ORDER — CEFAZOLIN SODIUM-DEXTROSE 2-3 GM-% IV SOLR
INTRAVENOUS | Status: AC
  Filled 2015-04-21: qty 50

## 2015-04-21 MED ORDER — OXYCODONE-ACETAMINOPHEN 5-325 MG PO TABS: 1.0000 | ORAL_TABLET | ORAL | Status: DC | PRN

## 2015-04-21 MED ORDER — GLYCOPYRROLATE 0.2 MG/ML IJ SOLN
INTRAMUSCULAR | Status: AC
  Filled 2015-04-21: qty 3

## 2015-04-21 MED ORDER — PROPOFOL 10 MG/ML IV BOLUS
INTRAVENOUS | Status: AC
  Filled 2015-04-21: qty 20

## 2015-04-21 MED ORDER — SODIUM CHLORIDE 0.9 % IJ SOLN
INTRAMUSCULAR | Status: AC
  Filled 2015-04-21: qty 10

## 2015-04-21 MED ORDER — MIDAZOLAM HCL 2 MG/2ML IJ SOLN
INTRAMUSCULAR | Status: AC
  Filled 2015-04-21: qty 4

## 2015-04-21 MED ORDER — GLYCOPYRROLATE 0.2 MG/ML IJ SOLN
INTRAMUSCULAR | Status: AC
Start: 2015-04-21 — End: 2015-04-21
  Filled 2015-04-21: qty 2

## 2015-04-21 MED ORDER — LIDOCAINE HCL (CARDIAC) 20 MG/ML IV SOLN
INTRAVENOUS | Status: AC
  Filled 2015-04-21: qty 5

## 2015-04-21 MED ORDER — CEFAZOLIN SODIUM-DEXTROSE 2-3 GM-% IV SOLR
2.0000 g | INTRAVENOUS | Status: AC
  Administered 2015-04-21: 2 g via INTRAVENOUS

## 2015-04-21 MED ORDER — GLYCOPYRROLATE 0.2 MG/ML IJ SOLN
INTRAMUSCULAR | Status: DC | PRN
  Administered 2015-04-21: 0.1 mg via INTRAVENOUS
  Administered 2015-04-21: 0.6 mg via INTRAVENOUS

## 2015-04-21 MED ORDER — ONDANSETRON HCL 4 MG/2ML IJ SOLN
INTRAMUSCULAR | Status: DC | PRN
  Administered 2015-04-21: 4 mg via INTRAVENOUS

## 2015-04-21 MED ORDER — ONDANSETRON HCL 4 MG/2ML IJ SOLN
INTRAMUSCULAR | Status: AC
  Filled 2015-04-21: qty 2

## 2015-04-21 MED ORDER — ENOXAPARIN SODIUM 40 MG/0.4ML ~~LOC~~ SOLN
40.0000 mg | SUBCUTANEOUS | Status: DC
  Administered 2015-04-21: 40 mg via SUBCUTANEOUS
  Filled 2015-04-21: qty 0.4

## 2015-04-21 MED ORDER — EPHEDRINE SULFATE 50 MG/ML IJ SOLN
INTRAMUSCULAR | Status: AC
  Filled 2015-04-21: qty 1

## 2015-04-21 MED ORDER — PHENYLEPHRINE 40 MCG/ML (10ML) SYRINGE FOR IV PUSH (FOR BLOOD PRESSURE SUPPORT)
PREFILLED_SYRINGE | INTRAVENOUS | Status: AC
  Filled 2015-04-21: qty 10

## 2015-04-21 MED ORDER — LACTATED RINGERS IV SOLN
INTRAVENOUS | Status: DC | PRN
  Administered 2015-04-21: 07:00:00 via INTRAVENOUS

## 2015-04-21 MED ORDER — ONDANSETRON HCL 4 MG/2ML IJ SOLN: 4.0000 mg | Freq: Once | INTRAMUSCULAR | Status: DC | PRN

## 2015-04-21 MED ORDER — SUCCINYLCHOLINE CHLORIDE 20 MG/ML IJ SOLN
INTRAMUSCULAR | Status: AC
  Filled 2015-04-21: qty 1

## 2015-04-21 MED ORDER — POTASSIUM CHLORIDE 2 MEQ/ML IV SOLN
INTRAVENOUS | Status: DC
  Filled 2015-04-21 (×2): qty 1000

## 2015-04-21 MED ORDER — HYDROMORPHONE HCL 1 MG/ML IJ SOLN: 0.2500 mg | INTRAMUSCULAR | Status: DC | PRN

## 2015-04-21 MED ORDER — HYDROMORPHONE HCL 1 MG/ML IJ SOLN
INTRAMUSCULAR | Status: AC
  Administered 2015-04-21: 0.5 mg via INTRAVENOUS
  Filled 2015-04-21: qty 1

## 2015-04-21 MED ORDER — FENTANYL CITRATE (PF) 100 MCG/2ML IJ SOLN
INTRAMUSCULAR | Status: DC | PRN
  Administered 2015-04-21: 50 ug via INTRAVENOUS
  Administered 2015-04-21: 150 ug via INTRAVENOUS
  Administered 2015-04-21: 50 ug via INTRAVENOUS

## 2015-04-21 MED ORDER — NITROGLYCERIN 0.4 MG SL SUBL: 0.4000 mg | SUBLINGUAL_TABLET | SUBLINGUAL | Status: DC | PRN

## 2015-04-21 MED ORDER — BUPIVACAINE-EPINEPHRINE 0.5% -1:200000 IJ SOLN
INTRAMUSCULAR | Status: DC | PRN
  Administered 2015-04-21: 10 mL

## 2015-04-21 MED ORDER — NEOSTIGMINE METHYLSULFATE 10 MG/10ML IV SOLN
INTRAVENOUS | Status: DC | PRN
  Administered 2015-04-21: 4 mg via INTRAVENOUS

## 2015-04-21 MED ORDER — SODIUM CHLORIDE 0.9 % IR SOLN
Status: DC | PRN
  Administered 2015-04-21: 1000 mL

## 2015-04-21 MED ORDER — PROPOFOL 10 MG/ML IV BOLUS
INTRAVENOUS | Status: DC | PRN
  Administered 2015-04-21: 130 mg via INTRAVENOUS

## 2015-04-21 MED ORDER — SODIUM CHLORIDE 0.9 % IV SOLN
INTRAVENOUS | Status: DC | PRN
  Administered 2015-04-21: 8 mL

## 2015-04-21 MED ORDER — MIDAZOLAM HCL 5 MG/5ML IJ SOLN
INTRAMUSCULAR | Status: DC | PRN
  Administered 2015-04-21: 2 mg via INTRAVENOUS

## 2015-04-21 MED ORDER — HYDROMORPHONE HCL 1 MG/ML IJ SOLN
0.2500 mg | INTRAMUSCULAR | Status: DC | PRN
  Administered 2015-04-21 (×2): 0.5 mg via INTRAVENOUS

## 2015-04-21 MED ORDER — BUPIVACAINE-EPINEPHRINE (PF) 0.5% -1:200000 IJ SOLN
INTRAMUSCULAR | Status: AC
  Filled 2015-04-21: qty 30

## 2015-04-21 MED ORDER — MEPERIDINE HCL 25 MG/ML IJ SOLN: 6.2500 mg | INTRAMUSCULAR | Status: DC | PRN

## 2015-04-21 MED ORDER — 0.9 % SODIUM CHLORIDE (POUR BTL) OPTIME
TOPICAL | Status: DC | PRN
  Administered 2015-04-21: 1000 mL

## 2015-04-21 MED ORDER — LIDOCAINE HCL (CARDIAC) 20 MG/ML IV SOLN
INTRAVENOUS | Status: DC | PRN
  Administered 2015-04-21: 100 mg via INTRAVENOUS

## 2015-04-21 MED ORDER — ONDANSETRON 4 MG PO TBDP: 4.0000 mg | ORAL_TABLET | Freq: Four times a day (QID) | ORAL | Status: DC | PRN

## 2015-04-21 MED ORDER — ROCURONIUM BROMIDE 50 MG/5ML IV SOLN
INTRAVENOUS | Status: AC
  Filled 2015-04-21: qty 1

## 2015-04-21 MED ORDER — DEXAMETHASONE SODIUM PHOSPHATE 4 MG/ML IJ SOLN
INTRAMUSCULAR | Status: DC | PRN
  Administered 2015-04-21: 4 mg via INTRAVENOUS

## 2015-04-21 MED ORDER — OXYCODONE-ACETAMINOPHEN 5-325 MG PO TABS
1.0000 | ORAL_TABLET | ORAL | Status: DC | PRN
  Administered 2015-04-21 – 2015-04-22 (×2): 1 via ORAL
  Filled 2015-04-21: qty 1
  Filled 2015-04-21: qty 2

## 2015-04-21 MED ORDER — FENTANYL CITRATE (PF) 250 MCG/5ML IJ SOLN
INTRAMUSCULAR | Status: AC
  Filled 2015-04-21: qty 5

## 2015-04-21 MED ORDER — ONDANSETRON HCL 4 MG/2ML IJ SOLN: 4.0000 mg | Freq: Four times a day (QID) | INTRAMUSCULAR | Status: DC | PRN

## 2015-04-21 MED ORDER — ROCURONIUM BROMIDE 100 MG/10ML IV SOLN
INTRAVENOUS | Status: DC | PRN
  Administered 2015-04-21: 40 mg via INTRAVENOUS

## 2015-04-21 SURGICAL SUPPLY — 45 items
APPLIER CLIP ROT 10 11.4 M/L (STAPLE) ×2
APR CLP MED LRG 11.4X10 (STAPLE) ×1
BAG SPEC RTRVL LRG 6X4 10 (ENDOMECHANICALS) ×1
BLADE SURG ROTATE 9660 (MISCELLANEOUS) IMPLANT
CANISTER SUCTION 2500CC (MISCELLANEOUS) ×2 IMPLANT
CHLORAPREP W/TINT 26ML (MISCELLANEOUS) ×2 IMPLANT
CLIP APPLIE ROT 10 11.4 M/L (STAPLE) ×1 IMPLANT
COVER MAYO STAND STRL (DRAPES) ×2 IMPLANT
COVER SURGICAL LIGHT HANDLE (MISCELLANEOUS) ×2 IMPLANT
DRAPE C-ARM 42X72 X-RAY (DRAPES) ×2 IMPLANT
ELECT REM PT RETURN 9FT ADLT (ELECTROSURGICAL) ×2
ELECTRODE REM PT RTRN 9FT ADLT (ELECTROSURGICAL) ×1 IMPLANT
GLOVE BIO SURGEON STRL SZ8 (GLOVE) ×1 IMPLANT
GLOVE BIOGEL PI IND STRL 6 (GLOVE) IMPLANT
GLOVE BIOGEL PI IND STRL 6.5 (GLOVE) IMPLANT
GLOVE BIOGEL PI IND STRL 8 (GLOVE) ×1 IMPLANT
GLOVE BIOGEL PI IND STRL 8.5 (GLOVE) IMPLANT
GLOVE BIOGEL PI INDICATOR 6 (GLOVE) ×1
GLOVE BIOGEL PI INDICATOR 6.5 (GLOVE) ×1
GLOVE BIOGEL PI INDICATOR 8 (GLOVE) ×1
GLOVE BIOGEL PI INDICATOR 8.5 (GLOVE) ×1
GLOVE ECLIPSE 7.5 STRL STRAW (GLOVE) ×2 IMPLANT
GOWN STRL REUS W/ TWL LRG LVL3 (GOWN DISPOSABLE) ×2 IMPLANT
GOWN STRL REUS W/ TWL XL LVL3 (GOWN DISPOSABLE) ×1 IMPLANT
GOWN STRL REUS W/TWL LRG LVL3 (GOWN DISPOSABLE) ×4
GOWN STRL REUS W/TWL XL LVL3 (GOWN DISPOSABLE) ×2
KIT BASIN OR (CUSTOM PROCEDURE TRAY) ×2 IMPLANT
KIT ROOM TURNOVER OR (KITS) ×2 IMPLANT
LIQUID BAND (GAUZE/BANDAGES/DRESSINGS) ×2 IMPLANT
NS IRRIG 1000ML POUR BTL (IV SOLUTION) ×2 IMPLANT
PAD ARMBOARD 7.5X6 YLW CONV (MISCELLANEOUS) ×2 IMPLANT
POUCH SPECIMEN RETRIEVAL 10MM (ENDOMECHANICALS) ×2 IMPLANT
SCISSORS LAP 5X35 DISP (ENDOMECHANICALS) ×2 IMPLANT
SET CHOLANGIOGRAPH 5 50 .035 (SET/KITS/TRAYS/PACK) ×2 IMPLANT
SET IRRIG TUBING LAPAROSCOPIC (IRRIGATION / IRRIGATOR) ×2 IMPLANT
SLEEVE ENDOPATH XCEL 5M (ENDOMECHANICALS) ×2 IMPLANT
SPECIMEN JAR SMALL (MISCELLANEOUS) ×2 IMPLANT
SUT MON AB 5-0 PS2 18 (SUTURE) ×2 IMPLANT
TOWEL OR 17X24 6PK STRL BLUE (TOWEL DISPOSABLE) ×2 IMPLANT
TOWEL OR 17X26 10 PK STRL BLUE (TOWEL DISPOSABLE) ×2 IMPLANT
TRAY LAPAROSCOPIC MC (CUSTOM PROCEDURE TRAY) ×2 IMPLANT
TROCAR XCEL BLUNT TIP 100MML (ENDOMECHANICALS) ×2 IMPLANT
TROCAR XCEL NON-BLD 11X100MML (ENDOMECHANICALS) ×2 IMPLANT
TROCAR XCEL NON-BLD 5MMX100MML (ENDOMECHANICALS) ×2 IMPLANT
TUBING INSUFFLATION (TUBING) ×2 IMPLANT

## 2015-04-21 NOTE — Interval H&P Note (Signed)
History and Physical Interval Note:  04/21/2015 7:03 AM  [name]  has presented today for surgery, with the diagnosis of gallstones  The various methods of treatment have been discussed with the patient and family. After consideration of risks, benefits and other options for treatment, the patient has consented to  Procedure(s): LAPAROSCOPIC CHOLECYSTECTOMY WITH INTRAOPERATIVE CHOLANGIOGRAM (N/A) as a surgical intervention .  The patient's history has been reviewed, patient examined, no change in status, stable for surgery.  I have reviewed the patient's chart and labs.  Questions were answered to the patient's satisfaction.     [name] T

## 2015-04-21 NOTE — Progress Notes (Signed)
Received pt from [facility] on stretcher.  Walked pt to bathroom but she was unable to void.  Pt very sleepy and unable to maintain sats on room air.  Sats dropped to 82 and HR dropped to 34.  I was able to rouse the patient by calling her name, however she consistently dropped her sats in the 56's.  Called [facility] and spoke with Valentino Nose and Drucie Opitz and made them aware that the patient was not alert enough to maintain her sats and took the patient back to PACU.

## 2015-04-21 NOTE — Transfer of Care (Signed)
Immediate Anesthesia Transfer of Care Note  Patient: [name]  Procedure(s) Performed: Procedure(s): LAPAROSCOPIC CHOLECYSTECTOMY WITH INTRAOPERATIVE CHOLANGIOGRAM (N/A)  Patient Location: PACU  Anesthesia Type:General  Level of Consciousness: awake, alert  and oriented  Airway & Oxygen Therapy: Patient Spontanous Breathing and Patient connected to nasal cannula oxygen  Post-op Assessment: Report given to RN and Post -op Vital signs reviewed and stable  Post vital signs: Reviewed and stable  Last Vitals:  Filed Vitals:   04/21/15 0828  BP:   Pulse:   Temp: 36.8 C  Resp:     Complications: No apparent anesthesia complications

## 2015-04-21 NOTE — Anesthesia Postprocedure Evaluation (Signed)
Anesthesia Post Note  Patient: [name]  Procedure(s) Performed: Procedure(s) (LRB): LAPAROSCOPIC CHOLECYSTECTOMY WITH INTRAOPERATIVE CHOLANGIOGRAM (N/A)  Anesthesia type: general  Patient location: PACU  Post pain: Pain level controlled  Post assessment: Patient's Cardiovascular Status Stable  Last Vitals:  Filed Vitals:   04/21/15 1230  BP: 101/52  Pulse: 42  Temp:   Resp:     Post vital signs: Reviewed and stable  Level of consciousness: sedated  Complications: No apparent anesthesia complications

## 2015-04-21 NOTE — Discharge Instructions (Signed)
[facility] ______[facility] °LAPAROSCOPIC SURGERY: POST OP INSTRUCTIONS °Always review your discharge instruction sheet given to you by the facility where your surgery was performed. °IF YOU HAVE DISABILITY OR FAMILY LEAVE FORMS, YOU MUST BRING THEM TO THE OFFICE FOR PROCESSING.   °DO NOT GIVE THEM TO YOUR DOCTOR. ° °1. A prescription for pain medication may be given to you upon discharge.  Take your pain medication as prescribed, if needed.  If narcotic pain medicine is not needed, then you may take acetaminophen (Tylenol) or ibuprofen (Advil) as needed. °2. Take your usually prescribed medications unless otherwise directed. °3. If you need a refill on your pain medication, please contact your pharmacy.  They will contact our office to request authorization. Prescriptions will not be filled after 5pm or on week-ends. °4. You should follow a light diet the first few days after arrival home, such as soup and crackers, etc.  Be sure to include lots of fluids daily. °5. Most patients will experience some swelling and bruising in the area of the incisions.  Ice packs will help.  Swelling and bruising can take several days to resolve.  °6. It is common to experience some constipation if taking pain medication after surgery.  Increasing fluid intake and taking a stool softener (such as Colace) will usually help or prevent this problem from occurring.  A mild laxative (Milk of Magnesia or Miralax) should be taken according to package instructions if there are no bowel movements after 48 hours. °7. Unless discharge instructions indicate otherwise, you may remove your bandages 24-48 hours after surgery, and you may shower at that time.  You may have steri-strips (small skin tapes) in place directly over the incision.  These strips should be left on the skin for 7-10 days.  If your surgeon used skin glue on the incision, you may shower in 24 hours.  The glue will flake off over the next 2-3 weeks.  Any sutures or  staples will be removed at the office during your follow-up visit. °8. ACTIVITIES:  You may resume regular (light) daily activities beginning the next day--such as daily self-care, walking, climbing stairs--gradually increasing activities as tolerated.  You may have sexual intercourse when it is comfortable.  Refrain from any heavy lifting or straining until approved by your doctor. °a. You may drive when you are no longer taking prescription pain medication, you can comfortably wear a seatbelt, and you can safely maneuver your car and apply brakes. °b. RETURN TO WORK:  __________________________________________________________ °9. You should see your doctor in the office for a follow-up appointment approximately 2-3 weeks after your surgery.  Make sure that you call for this appointment within a day or two after you arrive home to insure a convenient appointment time. °10. OTHER INSTRUCTIONS: __________________________________________________________________________________________________________________________ __________________________________________________________________________________________________________________________ °WHEN TO CALL YOUR DOCTOR: °1. Fever over 101.0 °2. Inability to urinate °3. Continued bleeding from incision. °4. Increased pain, redness, or drainage from the incision. °5. Increasing abdominal pain ° °The clinic staff is available to answer your questions during regular business hours.  Please don’t hesitate to call and ask to speak to one of the nurses for clinical concerns.  If you have a medical emergency, go to the nearest emergency room or call 911.  A surgeon from [facility] is always on call at the hospital. °[address], [address], [place], [place]  [address] ? [address] [address], [place], [place]   [address] °[phone] ? [phone] ? FAX [phone] °Web site:   [email or web address] °

## 2015-04-21 NOTE — H&P (Signed)
History of Present Illness Marland Kitchen T. Shelby Peltz MD; 04/02/2015 9:55 AM) Patient words: Evaluate gallbladder.  The patient is a 64 year old female who presents for evaluation of gall stones. She is referred by Dr. Hilarie Fredrickson for apparent symptomatic cholelithiasis. In mid July for sitting in church she had sudden onset of low chest pressure and then pain radiating to both shoulders. This was associated with diaphoresis but no nausea or vomiting. The symptoms persisted and she presented to the emergency room. She had a cardiac workup that was negative and was treated for the pain which improved and she was discharged. Later that day after eating a sandwich she developed recurrent pain and return to the emergency department. Bedside ultrasound indicated probable cholelithiasis and she was again treated and improved and went home. She is followed regularly by Dr. Hilarie Fredrickson and has had colonoscopy for adenomatous polyps. She saw him and was started on Protonix and gallbladder ultrasound was obtained. I reviewed this which shows multiple gallstones, normal common bile duct and no gallbladder wall thickening. Since that episode she has had no further pain. No nausea or dyspepsia or bloating. No fever chills or jaundice.   Other Problems Ivor Costa, [place]; 04/02/2015 9:19 AM) Diabetes Mellitus  Past Surgical History Ivor Costa, [place]; 04/02/2015 9:19 AM) Cataract Surgery Bilateral. Colon Polyp Removal - Colonoscopy Tonsillectomy  Diagnostic Studies History Ivor Costa, CMA; 04/02/2015 9:19 AM) Colonoscopy within last year Mammogram >3 years ago Pap Smear >5 years ago  Allergies Ivor Costa, [place]; 04/02/2015 9:20 AM) Procaine &Tetracaine-Dex *LOCAL ANESTHETICS-Parenteral* Zithromax *MACROLIDES*  Medication History Ivor Costa, CMA; 04/02/2015 9:21 AM) Pantoprazole Sodium (40MG  Tablet DR, Oral) Active. Probiotic Product (Oral) Active. Nitrostat (0.4MG  Tab Sublingual,  Sublingual) Active. Medications Reconciled  Social History Ivor Costa, [place]; 04/02/2015 9:19 AM) Caffeine use Carbonated beverages, Coffee, Tea. No alcohol use No drug use Tobacco use Never smoker.  Family History Ivor Costa, [place]; 04/02/2015 9:19 AM) Arthritis Mother. Cancer Father. Kidney Disease Sister. Migraine Headache Mother. Respiratory Condition Father.  Pregnancy / Birth History Ivor Costa, [place]; 04/02/2015 9:19 AM) Age at menarche 83 years. Age of menopause 72-55 Gravida 0 Para 0  Review of Systems Ivor Costa CMA; 04/02/2015 9:19 AM) General Present- Fatigue. Not Present- Appetite Loss, Chills, Fever, Night Sweats, Weight Gain and Weight Loss. Skin Present- Ulcer. Not Present- Change in Wart/Mole, Dryness, Hives, Jaundice, New Lesions, Non-Healing Wounds and Rash. Respiratory Present- Snoring. Not Present- Bloody sputum, Chronic Cough, Difficulty Breathing and Wheezing. Breast Not Present- Breast Mass, Breast Pain, Nipple Discharge and Skin Changes. Cardiovascular Present- Swelling of Extremities. Not Present- Chest Pain, Difficulty Breathing Lying Down, Leg Cramps, Palpitations, Rapid Heart Rate and Shortness of Breath. Gastrointestinal Not Present- Abdominal Pain, Bloating, Bloody Stool, Change in Bowel Habits, Chronic diarrhea, Constipation, Difficulty Swallowing, Excessive gas, Gets full quickly at meals, Hemorrhoids, Indigestion, Nausea, Rectal Pain and Vomiting. Female Genitourinary Not Present- Frequency, Nocturia, Painful Urination, Pelvic Pain and Urgency. Musculoskeletal Present- Joint Pain. Not Present- Back Pain, Joint Stiffness, Muscle Pain, Muscle Weakness and Swelling of Extremities. Neurological Not Present- Decreased Memory, Fainting, Headaches, Numbness, Seizures, Tingling, Tremor, Trouble walking and Weakness. Psychiatric Not Present- Anxiety, Bipolar, Change in Sleep Pattern, Depression, Fearful and Frequent crying. Endocrine Not  Present- Cold Intolerance, Excessive Hunger, Hair Changes, Heat Intolerance, Hot flashes and New Diabetes. Hematology Present- Easy Bruising. Not Present- Excessive bleeding, Gland problems, HIV and Persistent Infections.   Vitals Ivor Costa CMA; 04/02/2015 9:19 AM) 04/02/2015 9:19 AM Weight: 235.2 lb Temp.: 97.45F(Temporal)  Pulse: 76 (Regular)  Resp.: 16 (Unlabored)  BP: 132/84 (Sitting, Left Arm, Standard)    Physical Exam Marland Kitchen T. Tewana Bohlen MD; 04/02/2015 9:56 AM) The physical exam findings are as follows: Note:General: Alert, moderately obese Caucasian female, in no distress Skin: Warm and dry without rash or infection. HEENT: No palpable masses or thyromegaly. Sclera nonicteric. Pupils equal round and reactive. Oropharynx clear. Lymph nodes: No cervical, supraclavicular, or inguinal nodes palpable. Lungs: Breath sounds clear and equal. No wheezing or increased work of breathing. Cardiovascular: Regular rate and rhythm without murmer. 1+ lower extremity edema Abdomen: Nondistended. Soft and nontender. No masses palpable. No organomegaly. No palpable hernias. Extremities: 1+ lower extremity edema. No chronic venous stasis changes. Neurologic: Alert and fully oriented. Gait normal. No focal weakness. Psychiatric: Normal mood and affect. Thought content appropriate with normal judgement and insight    Assessment & Plan Marland Kitchen T. Bryella Diviney MD; 04/02/2015 10:07 AM) CALCULUS OF GALLBLADDER WITH CHRONIC CHOLECYSTITIS WITHOUT OBSTRUCTION (574.10  K80.10) Impression: Episode of severe low chest and back pain entirely consistent with biliary colic and gallbladder ultrasound has shown multiple gallstones. Cardiac disease has been ruled out. She is at risk for further episodes. I recommended laparoscopic cholecystectomy with cholangiogram to prevent further episodes and an further complications from her gallstones. I discussed the procedure in detail. The patient was given  Neurosurgeon. We discussed the risks and benefits of a laparoscopic cholecystectomy and possible cholangiogram including, but not limited to, bleeding, infection, injury to surrounding structures such as the intestine or liver, bile leak, retained gallstones, need to convert to an open procedure, prolonged diarrhea, blood clots such as DVT, common bile duct injury, anesthesia risks, and possible need for additional procedures. The likelihood of improvement in symptoms and return to the patient's normal status is good. We discussed the typical post-operative recovery course. All questions were answered. She will call to schedule. Current Plans  Pt Education - Pamphlet Given - Laparoscopic Gallbladder Surgery: discussed with patient and provided information. Schedule for Surgery Laparoscopic cholecystectomy with intraoperative cholangiogram under general anesthesia as an outpatient

## 2015-04-21 NOTE — Op Note (Signed)
Preoperative diagnosis: Cholelithiasis and cholecystitis  Postoperative diagnosis: Cholelithiasis and cholecystitis  Surgical procedure: Laparoscopic cholecystectomy with intraoperative cholangiogram  Surgeon: Marland Kitchen T. Dantonio Justen M.D.  Assistant: None  Anesthesia: General Endotracheal  Complications: None  Estimated blood loss: Minimal  Description of procedure: The patient brought to the operating room, placed in the supine position on the operating table, and general endotracheal anesthesia induced. The abdomen was widely sterilely prepped and draped. The patient had received preoperative IV antibiotics and PAS were in place. Patient timeout was performed the correct procedure verified. Standard 4 port technique was used with an open Hassan cannula at the umbilicus and the remainder of the ports placed under direct vision. The gallbladder was visualized. It appeared mildly chronically inflamed. The fundus was grasped and elevated up over the liver and the infundibulum retracted inferiolaterally. Peritoneum anterior and posterior to close triangle was incised and fibrofatty tissue stripped off the neck of the gallbladder toward the porta hepatis. The distal gallbladder was thoroughly dissected. The cystic artery was identified in close triangle and the cystic duct gallbladder junction dissected 360.  A good critical view was obtained. When the anatomy was clear the cystic duct was clipped at the gallbladder junction and an operative cholangiogram obtained through the cystic duct. This showed good filling of a normal common bile duct and intrahepatic ducts with free flow into the duodenum and no filling defects. Following this the Cholangiocath was removed and the cystic duct was doubly clipped proximally and divided. The cystic artery was doubly clipped proximally and distally and divided. The gallbladder was dissected free from its bed using hook cautery and removed through the umbilical port  site. Complete hemostasis was obtained in the gallbladder bed. The right upper quadrant was thoroughly irrigated and hemostasis assured. Trochars were removed and all CO2 evacuated and the [facility] trocar site fascial defect closed. Skin incisions were closed with subcuticular Monocryl and Dermabond. Sponge needle and instrument counts were correct. The patient was taken to PACU in good condition.  Yaminah Clayborn T  04/21/2015

## 2015-04-21 NOTE — Progress Notes (Signed)
Pt brought over to phase 2 because short stay RN stated that her oxygen sats were dropping and that they do not supply O2. Pt attached to monitor in Phase 2- 93% on room air and HR 45. Dr.Ossey notified of situation. No new orders--states he is okay with pt's bradycardia as long as BP stays WNL. Will cont to monitor.

## 2015-04-21 NOTE — Anesthesia Preprocedure Evaluation (Addendum)
Anesthesia Evaluation  Patient identified by MRN, date of birth, ID band Patient awake    Reviewed: Allergy & Precautions, NPO status , Patient's Chart, lab work & pertinent test results  Airway Mallampati: II  TM Distance: >3 FB Neck ROM: Full    Dental   Pulmonary    Pulmonary exam normal        Cardiovascular Normal cardiovascular exam     Neuro/Psych    GI/Hepatic   Endo/Other  diabetes, Type 2, Oral Hypoglycemic AgentsMorbid obesity  Renal/GU      Musculoskeletal   Abdominal   Peds  Hematology   Anesthesia Other Findings   Reproductive/Obstetrics                           Anesthesia Physical Anesthesia Plan  ASA: III  Anesthesia Plan: General   Post-op Pain Management:    Induction: Intravenous  Airway Management Planned: Oral ETT  Additional Equipment:   Intra-op Plan:   Post-operative Plan: Extubation in OR  Informed Consent: I have reviewed the patients History and Physical, chart, labs and discussed the procedure including the risks, benefits and alternatives for the proposed anesthesia with the patient or authorized representative who has indicated his/her understanding and acceptance.   Dental advisory given  Plan Discussed with: Surgeon and CRNA  Anesthesia Plan Comments:       Anesthesia Quick Evaluation

## 2015-04-21 NOTE — Progress Notes (Signed)
Spoke with Dr.Hoxworth who is putting in admission orders. Will cont to monitor.

## 2015-04-21 NOTE — Progress Notes (Signed)
Placed call to Dr. Excell Seltzer to notify pt is still having difficulty maintaining O2 sat, drifting to sleep often. OR nurse at [employer] that he will call back when he finishes his case. No new orders at this time. Will cont to monitor.

## 2015-04-21 NOTE — Anesthesia Procedure Notes (Signed)
Procedure Name: Intubation Date/Time: 04/21/2015 7:24 AM Performed by: Susa Loffler Pre-anesthesia Checklist: Patient identified, Timeout performed, Emergency Drugs available, Suction available and Patient being monitored Patient Re-evaluated:Patient Re-evaluated prior to inductionOxygen Delivery Method: Circle system utilized Preoxygenation: Pre-oxygenation with 100% oxygen Intubation Type: IV induction Ventilation: Mask ventilation without difficulty Laryngoscope Size: Mac and 3 Grade View: Grade II Tube type: Oral Tube size: 7.0 mm Number of attempts: 1 Airway Equipment and Method: Stylet Placement Confirmation: ETT inserted through vocal cords under direct vision,  positive ETCO2 and breath sounds checked- equal and bilateral Secured at: 21 cm Tube secured with: Tape Dental Injury: Teeth and Oropharynx as per pre-operative assessment

## 2015-04-22 ENCOUNTER — Encounter (HOSPITAL_COMMUNITY): Payer: Self-pay | Admitting: General Surgery

## 2015-04-22 DIAGNOSIS — K8010 Calculus of gallbladder with chronic cholecystitis without obstruction: Secondary | ICD-10-CM | POA: Diagnosis not present

## 2015-04-22 NOTE — Discharge Summary (Signed)
   Patient ID: [name] [id number] 64 y.o. Jun 12, 1951  04/21/2015  Discharge date and time: 04/22/2015   Admitting Physician: Excell Seltzer T  Discharge Physician: Excell Seltzer T  Admission Diagnoses: gallstones  Discharge Diagnoses: Same  Operations: Procedure(s): LAPAROSCOPIC CHOLECYSTECTOMY WITH INTRAOPERATIVE CHOLANGIOGRAM  Admission Condition: good  Discharged Condition: good  Indication for Admission: Patient presents with episodes of typical biliary colic and gallbladder ultrasound showing gallstones. She is admitted following laparoscopic cholecystectomy as she had some persistently low oxygen saturations off of oxygen and drowsiness postoperatively.  Hospital Course: Patient was admitted for reasons above for overnight observation. The following morning she feels well. Oxygen saturations are 97% off O2. She is completely alert. No abdominal pain. Abdominal exam is benign.  Disposition: Home  Patient Instructions:    Medication List    TAKE these medications        nitroGLYCERIN 0.4 MG SL tablet  Commonly known as:  NITROSTAT  Place 1 tablet (0.4 mg total) under the tongue every 5 (five) minutes as needed for chest pain.     OVER THE COUNTER MEDICATION  Take 1 tablet by mouth daily as needed (OEHOZY2 - take for pain).     oxyCODONE-acetaminophen 5-325 MG per tablet  Commonly known as:  ROXICET  Take 1-2 tablets by mouth every 4 (four) hours as needed for severe pain.     Probiotic Caps  Take 1 capsule by mouth daily.     SYSTANE OP  Apply 1-2 drops to eye daily as needed (dry eyes).        Activity: activity as tolerated Diet: regular diet Wound Care: none needed  Follow-up:  With Dr. Excell Seltzer in 3 weeks.  Signed: Edward Jolly MD, FACS  04/22/2015, 8:18 AM

## 2015-04-22 NOTE — Progress Notes (Signed)
Esau Grew to be D/C'd Home per MD order.  Discussed with the patient and all questions fully answered.  VSS, Skin clean, dry and intact without evidence of skin break down, no evidence of skin tears noted. IV catheter discontinued intact. Site without signs and symptoms of complications. Dressing and pressure applied. Pt's umbilical surgical site has some minimal bleeding and gauze changed for patient prior to d/c and supplies sent home with patient for the next day two per MD. If bleeding continues pt will notify MD.    An After Visit Summary was printed and given to the patient. Patient received prescription.  D/c education completed with patient/family including follow up instructions, medication list, d/c activities limitations if indicated, with other d/c instructions as indicated by MD - patient able to verbalize understanding, all questions fully answered.   Patient instructed to return to ED, call 911, or call MD for any changes in condition.   Patient will be escorted via WC, and D/C home via private auto.  Norva Karvonen 04/22/2015 10:47 AM

## 2015-04-22 NOTE — Progress Notes (Signed)
Patient ID: [name], female   DOB: 03-31-1951, 64 y.o.   MRN: [id number] 1 Day Post-Op  Subjective: Feels well this morning. Has some pain at the base of her neck. No pain with motion. Denies abdominal pain or nausea. Patient was observed overnight due to drowsiness and low O2 saturations.  Objective: Vital signs in last 24 hours: Temp:  [96.8 F (36 C)-98.6 F (37 C)] 98.1 F (36.7 C) (09/14 0614) Pulse Rate:  [38-147] 54 (09/14 0614) Resp:  [9-18] 18 (09/14 0614) BP: (98-135)/(44-66) 98/53 mmHg (09/14 0614) SpO2:  [90 %-100 %] 97 % (09/14 0614)    Intake/Output from previous day: 09/13 0701 - 09/14 0700 In: 2470 [P.O.:1270; I.V.:1200] Out: 25 [Blood:25] Intake/Output this shift:    General appearance: alert, cooperative and no distress Resp: clear to auscultation bilaterally GI: normal findings: soft, non-tender Incision/Wound: no evidence of infection with tiny bit of bloody drainage from umbilical incision.  Lab Results:  No results for input(s): WBC, HGB, HCT, PLT in the last 72 hours. BMET No results for input(s): NA, K, CL, CO2, GLUCOSE, BUN, CREATININE, CALCIUM in the last 72 hours.   Studies/Results: Dg Cholangiogram Operative  04/21/2015   CLINICAL DATA:  Gallstones. Intraoperative cholangiogram during laparoscopic cholecystectomy  EXAM: INTRAOPERATIVE CHOLANGIOGRAM  FLUOROSCOPY TIME:  10 seconds  COMPARISON:  Abdominal ultrasound - 03/04/2015  FINDINGS: Intraoperative cholangiographic images of the right upper abdominal quadrant during laparoscopic cholecystectomy are provided for review.  Surgical clips overlie the expected location of the gallbladder fossa.  Contrast injection demonstrates selective cannulation of the central aspect of the cystic duct.  There is passage of contrast through the central aspect of the cystic duct with filling of a non dilated common bile duct. There is passage of contrast though the CBD and into the descending portion of the  duodenum.  There is minimal reflux of injected contrast into the common hepatic duct and central aspect of the non dilated intrahepatic biliary system. There is minimal opacification of the central aspect of the pancreatic duct.  There are no discrete filling defects within the opacified portions of the biliary system to suggest the presence of choledocholithiasis.  IMPRESSION: No evidence of choledocholithiasis.   Electronically Signed   By: Sandi Mariscal M.D.   On: 04/21/2015 08:47    Anti-infectives: Anti-infectives    Start     Dose/Rate Route Frequency Ordered Stop   04/21/15 0553  ceFAZolin (ANCEF) IVPB 2 g/50 mL premix     2 g 100 mL/hr over 30 Minutes Intravenous On call to O.R. 04/21/15 0554 04/21/15 0730   04/21/15 0529  ceFAZolin (ANCEF) 2-3 GM-% IVPB SOLR    Comments:  Scronce, Trina   : cabinet override      04/21/15 0529 04/21/15 1744      Assessment/Plan: s/p Procedure(s): LAPAROSCOPIC CHOLECYSTECTOMY WITH INTRAOPERATIVE CHOLANGIOGRAM Observed overnight due to drowsiness and low O2 saturations that have resolved. No apparent postoperative complications. Has some neck pain which may be diaphragmatic CO2 irritation. Okay for discharge.      Teniya Filter T 04/22/2015

## 2016-07-28 ENCOUNTER — Ambulatory Visit (INDEPENDENT_AMBULATORY_CARE_PROVIDER_SITE_OTHER)
Admission: RE | Admit: 2016-07-28 | Discharge: 2016-07-28 | Disposition: A | Discharge status: Home / Self Care | Payer: Medicare HMO | Source: Ambulatory Visit | Attending: Nurse Practitioner | Admitting: Nurse Practitioner

## 2016-07-28 ENCOUNTER — Encounter: Payer: Self-pay | Admitting: Nurse Practitioner

## 2016-07-28 ENCOUNTER — Ambulatory Visit (INDEPENDENT_AMBULATORY_CARE_PROVIDER_SITE_OTHER): Payer: Medicare HMO | Admitting: Nurse Practitioner

## 2016-07-28 VITALS — BP 140/86 | HR 96 | Temp 98.6°F | Ht 68.0 in | Wt 251.0 lb

## 2016-07-28 DIAGNOSIS — J209 Acute bronchitis, unspecified: Secondary | ICD-10-CM | POA: Diagnosis not present

## 2016-07-28 DIAGNOSIS — J069 Acute upper respiratory infection, unspecified: Secondary | ICD-10-CM | POA: Diagnosis not present

## 2016-07-28 DIAGNOSIS — R058 Other specified cough: Secondary | ICD-10-CM

## 2016-07-28 DIAGNOSIS — R05 Cough: Secondary | ICD-10-CM | POA: Diagnosis not present

## 2016-07-28 MED ORDER — GUAIFENESIN ER 600 MG PO TB12: 600.0000 mg | ORAL_TABLET | Freq: Two times a day (BID) | ORAL | 0 refills | Status: DC | PRN

## 2016-07-28 MED ORDER — ALBUTEROL SULFATE HFA 108 (90 BASE) MCG/ACT IN AERS
2.0000 | INHALATION_SPRAY | Freq: Four times a day (QID) | RESPIRATORY_TRACT | 0 refills | Status: DC | PRN
Start: 2016-07-28 — End: 2023-06-20

## 2016-07-28 MED ORDER — HYDROCODONE-HOMATROPINE 5-1.5 MG/5ML PO SYRP: 5.0000 mL | ORAL_SOLUTION | Freq: Every evening | ORAL | 0 refills | Status: DC | PRN

## 2016-07-28 MED ORDER — FLUTICASONE PROPIONATE 50 MCG/ACT NA SUSP: 2.0000 | Freq: Every day | NASAL | 0 refills | Status: DC

## 2016-07-28 NOTE — Progress Notes (Signed)
Subjective:  Patient ID: [name], female    DOB: November 09, 1950  Age: 65 y.o. MRN: [id number]  CC: Cough (coughing slight yellow mucus,congestion,fever for 4 days. took asprin and benadyl)   Cough  This is a recurrent problem. The current episode started 1 to 4 weeks ago (on and off for 8weeks). The problem has been waxing and waning. The problem occurs constantly. The cough is productive of sputum and productive of purulent sputum. Associated symptoms include chills, headaches, nasal congestion, postnasal drip, rhinorrhea and a sore throat. Pertinent negatives include no chest pain, fever, heartburn, hemoptysis, myalgias, rash, shortness of breath, sweats, weight loss or wheezing. The symptoms are aggravated by lying down. She has tried OTC cough suppressant for the symptoms. The treatment provided no relief. There is no history of asthma, bronchitis, COPD or environmental allergies.    Outpatient Medications Prior to Visit  Medication Sig Dispense Refill  . PROBIOTIC CAPS Take 1 capsule by mouth daily.      Marland Kitchen OVER THE COUNTER MEDICATION Take 1 tablet by mouth daily as needed [id number] - take for pain).    Vladimir Faster Glycol-Propyl Glycol (SYSTANE OP) Apply 1-2 drops to eye daily as needed (dry eyes).    . nitroGLYCERIN (NITROSTAT) 0.4 MG SL tablet Place 1 tablet (0.4 mg total) under the tongue every 5 (five) minutes as needed for chest pain. (Patient not taking: Reported on 07/28/2016) 30 tablet 0  . oxyCODONE-acetaminophen (ROXICET) 5-325 MG per tablet Take 1-2 tablets by mouth every 4 (four) hours as needed for severe pain. (Patient not taking: Reported on 07/28/2016) 30 tablet 0   No facility-administered medications prior to visit.     ROS See HPI  Objective:  BP 140/86   Pulse 96   Temp 98.6 F (37 C)   Ht 5\' 8"  (1.727 m)   Wt 251 lb (113.9 kg)   SpO2 96%   BMI 38.16 kg/m   BP Readings from Last 3 Encounters:  07/28/16 140/86  04/22/15 (!) 98/53  04/15/15 126/64     Wt Readings from Last 3 Encounters:  07/28/16 251 lb (113.9 kg)  04/21/15 234 lb 12.8 oz (106.5 kg)  04/15/15 234 lb 12.8 oz (106.5 kg)    Physical Exam  Constitutional: She is oriented to person, place, and time.  HENT:  Right Ear: Tympanic membrane, external ear and ear canal normal.  Left Ear: Tympanic membrane, external ear and ear canal normal.  Nose: Mucosal edema present. No rhinorrhea. Right sinus exhibits no maxillary sinus tenderness and no frontal sinus tenderness. Left sinus exhibits no maxillary sinus tenderness and no frontal sinus tenderness.  Mouth/Throat: Uvula is midline. No trismus in the jaw. Posterior oropharyngeal erythema present. No oropharyngeal exudate.  Eyes: No scleral icterus.  Neck: Normal range of motion. Neck supple.  Cardiovascular: Normal rate and normal heart sounds.   Pulmonary/Chest: Effort normal and breath sounds normal.  Musculoskeletal: She exhibits no edema.  Lymphadenopathy:    She has no cervical adenopathy.  Neurological: She is alert and oriented to person, place, and time.  Vitals reviewed.   Lab Results  Component Value Date   WBC 5.9 04/15/2015   HGB 15.1 (H) 04/15/2015   HCT 45.0 04/15/2015   PLT 229 04/15/2015   GLUCOSE 107 (H) 04/15/2015   CHOL 167 02/16/2015   TRIG 146 02/16/2015   HDL 47 02/16/2015   LDLCALC 91 02/16/2015   ALT 33 02/15/2015   AST 48 (H) 02/15/2015   NA 140 04/15/2015  K 4.0 04/15/2015   CL 110 04/15/2015   CREATININE 0.96 04/15/2015   BUN 14 04/15/2015   CO2 23 04/15/2015   TSH 2.87 01/20/2015   INR 1.08 01/17/2015   HGBA1C 6.0 (H) 04/15/2015   MICROALBUR 0.4 07/22/2013    Dg Chest 2 View  Result Date: 07/28/2016 CLINICAL DATA:  Cough and congestion for 4 days, low-grade fever, diabetes mellitus EXAM: CHEST  2 VIEW COMPARISON:  02/15/2015 FINDINGS: Minimal enlargement of cardiac silhouette. Mediastinal contours and pulmonary vascularity normal. Mild bronchitic changes. No pulmonary  infiltrate, pleural effusion or pneumothorax. Bones demineralized. IMPRESSION: Mild bronchitic changes without infiltrate. Electronically Signed   By: Lavonia Dana M.D.   On: 07/28/2016 09:21    Assessment & Plan:   [name] was seen today for cough.  Diagnoses and all orders for this visit:  Acute URI -     HYDROcodone-homatropine (HYCODAN) 5-1.5 MG/5ML syrup; Take 5 mLs by mouth at bedtime as needed for cough. -     guaiFENesin (MUCINEX) 600 MG 12 hr tablet; Take 1 tablet (600 mg total) by mouth 2 (two) times daily as needed for cough or to loosen phlegm. -     fluticasone (FLONASE) 50 MCG/ACT nasal spray; Place 2 sprays into both nostrils daily. -     albuterol (PROVENTIL HFA;VENTOLIN HFA) 108 (90 Base) MCG/ACT inhaler; Inhale 2 puffs into the lungs every 6 (six) hours as needed for wheezing or shortness of breath.  Acute bronchitis, unspecified organism -     HYDROcodone-homatropine (HYCODAN) 5-1.5 MG/5ML syrup; Take 5 mLs by mouth at bedtime as needed for cough. -     guaiFENesin (MUCINEX) 600 MG 12 hr tablet; Take 1 tablet (600 mg total) by mouth 2 (two) times daily as needed for cough or to loosen phlegm. -     fluticasone (FLONASE) 50 MCG/ACT nasal spray; Place 2 sprays into both nostrils daily. -     albuterol (PROVENTIL HFA;VENTOLIN HFA) 108 (90 Base) MCG/ACT inhaler; Inhale 2 puffs into the lungs every 6 (six) hours as needed for wheezing or shortness of breath. -     DG Chest 2 View; Future   I have discontinued Ms. Newport's nitroGLYCERIN and oxyCODONE-acetaminophen. I am also having her start on HYDROcodone-homatropine, guaiFENesin, fluticasone, and albuterol. Additionally, I am having her maintain her Probiotic, Polyethyl Glycol-Propyl Glycol (SYSTANE OP), OVER THE COUNTER MEDICATION, cyanocobalamin, COLLAGEN PO, and Vitamin D (Ergocalciferol).  Meds ordered this encounter  Medications  . cyanocobalamin 500 MCG tablet    Sig: Take 500 mcg by mouth daily.  . COLLAGEN PO    Sig:  Take by mouth.  . Vitamin D, Ergocalciferol, (DRISDOL) 50000 units CAPS capsule    Sig: Take 50,000 Units by mouth every 7 (seven) days.  Marland Kitchen HYDROcodone-homatropine (HYCODAN) 5-1.5 MG/5ML syrup    Sig: Take 5 mLs by mouth at bedtime as needed for cough.    Dispense:  120 mL    Refill:  0    Order Specific Question:   Supervising Provider    Answer:   Cassandria Anger [[id number]]  . guaiFENesin (MUCINEX) 600 MG 12 hr tablet    Sig: Take 1 tablet (600 mg total) by mouth 2 (two) times daily as needed for cough or to loosen phlegm.    Dispense:  14 tablet    Refill:  0    Order Specific Question:   Supervising Provider    Answer:   Cassandria Anger [[id number]]  . fluticasone (FLONASE) 50 MCG/ACT nasal spray  Sig: Place 2 sprays into both nostrils daily.    Dispense:  16 g    Refill:  0    Order Specific Question:   Supervising Provider    Answer:   Cassandria Anger [[id number]]  . albuterol (PROVENTIL HFA;VENTOLIN HFA) 108 (90 Base) MCG/ACT inhaler    Sig: Inhale 2 puffs into the lungs every 6 (six) hours as needed for wheezing or shortness of breath.    Dispense:  1 Inhaler    Refill:  0    Order Specific Question:   Supervising Provider    Answer:   Cassandria Anger [[id number]]    Follow-up: Return if symptoms worsen or fail to improve.  Wilfred Lacy, NP

## 2016-07-28 NOTE — Patient Instructions (Addendum)
CXR indicates acute bronchitis. No pneumonia  URI Instructions: Flonase and Afrin use: apply 1spray of afrin in each nare, wait 32mins, then apply 2sprays of flonase in each nare. Use both nasal spray consecutively x 3days, then flonase only for at least 14days.  Encourage adequate oral hydration.  Use over-the-counter  "cold" medicines  such as "Tylenol cold" , "Advil cold",  "Mucinex"  for cough and congestion.  Avoid decongestants if you have high blood pressure. Use" Delsym" or" Robitussin" cough syrup varietis for cough.  You can use plain "Tylenol" or "Advi"l for fever, chills and achyness.

## 2016-07-28 NOTE — Progress Notes (Signed)
Pre visit review using our clinic review tool, if applicable. No additional management support is needed unless otherwise documented below in the visit note. 

## 2016-10-10 ENCOUNTER — Ambulatory Visit: Payer: Medicare HMO | Admitting: Nurse Practitioner

## 2018-03-21 DIAGNOSIS — R69 Illness, unspecified: Secondary | ICD-10-CM | POA: Diagnosis not present

## 2018-12-25 DIAGNOSIS — R69 Illness, unspecified: Secondary | ICD-10-CM | POA: Diagnosis not present

## 2019-01-09 DIAGNOSIS — R69 Illness, unspecified: Secondary | ICD-10-CM | POA: Diagnosis not present

## 2019-04-02 DIAGNOSIS — R69 Illness, unspecified: Secondary | ICD-10-CM | POA: Diagnosis not present

## 2019-07-15 ENCOUNTER — Encounter: Payer: Self-pay | Admitting: *Deleted

## 2020-05-06 ENCOUNTER — Encounter: Payer: Self-pay | Admitting: Internal Medicine

## 2020-06-23 ENCOUNTER — Ambulatory Visit (AMBULATORY_SURGERY_CENTER): Payer: Self-pay

## 2020-06-23 ENCOUNTER — Encounter: Payer: Self-pay | Admitting: Internal Medicine

## 2020-06-23 ENCOUNTER — Other Ambulatory Visit: Payer: Self-pay

## 2020-06-23 VITALS — Ht 68.0 in | Wt 227.0 lb

## 2020-06-23 DIAGNOSIS — Z86010 Personal history of colonic polyps: Secondary | ICD-10-CM

## 2020-06-23 NOTE — Progress Notes (Signed)
No allergies to soy or egg Pt is not on blood thinners or diet pills Denies issues with sedation/intubation Denies atrial flutter/fib Denies constipation    Pt is aware of Covid safety and care partner requirements.   Per pt's request chg'd prep to Miralax.  Discussed Suprep and Golytely.  Did not feel could take sutab with pills.

## 2020-07-07 ENCOUNTER — Encounter: Payer: Medicare HMO | Admitting: Internal Medicine

## 2023-03-09 ENCOUNTER — Encounter: Payer: Self-pay | Admitting: Internal Medicine

## 2023-05-23 ENCOUNTER — Ambulatory Visit (AMBULATORY_SURGERY_CENTER): Payer: Medicare HMO

## 2023-05-23 VITALS — Ht 68.0 in | Wt 203.8 lb

## 2023-05-23 DIAGNOSIS — Z860100 Personal history of colon polyps, unspecified: Secondary | ICD-10-CM

## 2023-05-23 NOTE — Progress Notes (Signed)
No egg or soy allergy known to patient  No issues known to pt with past sedation with any surgeries or procedures Patient denies ever being told they had issues or difficulty with intubation  No FH of Malignant Hyperthermia Pt is not on diet pills Pt is not on  home 02  Pt is not on blood thinners  Pt denies issues with constipation  No A fib or A flutter Have any cardiac testing pending--no  LOA: independent  Prep: spilt dose miralax   Patient's chart reviewed by Cathlyn Parsons CNRA prior to previsit and patient appropriate for the [facility].  Previsit completed and red dot placed by patient's name on their procedure day (on provider's schedule).     PV competed with patient. Prep instructions sent via mychart and hard copy given at [facility]

## 2023-06-02 ENCOUNTER — Encounter: Payer: Self-pay | Admitting: Internal Medicine

## 2023-06-20 ENCOUNTER — Ambulatory Visit: Payer: Medicare HMO | Admitting: Internal Medicine

## 2023-06-20 ENCOUNTER — Encounter: Payer: Self-pay | Admitting: Internal Medicine

## 2023-06-20 VITALS — BP 139/75 | HR 60 | Temp 97.2°F | Resp 16 | Ht 68.0 in | Wt 203.8 lb

## 2023-06-20 DIAGNOSIS — Z860100 Personal history of colon polyps, unspecified: Secondary | ICD-10-CM

## 2023-06-20 DIAGNOSIS — Z09 Encounter for follow-up examination after completed treatment for conditions other than malignant neoplasm: Secondary | ICD-10-CM

## 2023-06-20 DIAGNOSIS — K635 Polyp of colon: Secondary | ICD-10-CM | POA: Diagnosis not present

## 2023-06-20 DIAGNOSIS — D123 Benign neoplasm of transverse colon: Secondary | ICD-10-CM

## 2023-06-20 MED ORDER — SODIUM CHLORIDE 0.9 % IV SOLN: 500.0000 mL | Freq: Once | INTRAVENOUS | Status: DC

## 2023-06-20 NOTE — Progress Notes (Signed)
GASTROENTEROLOGY PROCEDURE H&P NOTE   Primary Care Physician: Ailene Ravel, MD    Reason for Procedure:  History of colon polyps  Plan:    Colonoscopy  Patient is appropriate for endoscopic procedure(s) in the ambulatory ([facility]) setting.  The nature of the procedure, as well as the risks, benefits, and alternatives were carefully and thoroughly reviewed with the patient. Ample time for discussion and questions allowed. The patient understood, was satisfied, and agreed to proceed.     HPI: [name] is a 72 y.o. female who presents for colonoscopy.  Medical history as below.  Tolerated the prep.  No recent chest pain or shortness of breath.  No abdominal pain today.  Past Medical History:  Diagnosis Date   Cataract    Bil; has been removed   Diabetes mellitus    controlled by diet/ no meds   Diverticulosis    GERD (gastroesophageal reflux disease)    History of pneumonia as a child     Past Surgical History:  Procedure Laterality Date   CATARACT EXTRACTION  2008   Bil   CHOLECYSTECTOMY N/A 04/21/2015   Procedure: LAPAROSCOPIC CHOLECYSTECTOMY WITH INTRAOPERATIVE CHOLANGIOGRAM;  Surgeon: Glenna Fellows, MD;  Location: [facility] OR;  Service: General;  Laterality: N/A;   EYE SURGERY  1991   Detached retina   RETINAL DETACHMENT SURGERY  1991   Bil   TONSILLECTOMY  1957    Prior to Admission medications   Medication Sig Start Date End Date Taking? Authorizing Provider  Ascorbic Acid (VITAMIN C PO) Take by mouth.    [provider]  COLLAGEN PO Take by mouth. Patient not taking: Reported on 05/23/2023    [provider]  cyanocobalamin 500 MCG tablet Take 500 mcg by mouth daily.    [provider]  Polyethyl Glycol-Propyl Glycol (SYSTANE OP) Apply 1-2 drops to eye daily as needed (dry eyes). Patient not taking: Reported on 05/23/2023    [provider]  PROBIOTIC CAPS Take 1 capsule by mouth daily.   Patient not taking:  Reported on 05/23/2023    [provider]    Current Outpatient Medications  Medication Sig Dispense Refill   Ascorbic Acid (VITAMIN C PO) Take by mouth.     COLLAGEN PO Take by mouth. (Patient not taking: Reported on 05/23/2023)     cyanocobalamin 500 MCG tablet Take 500 mcg by mouth daily.     Polyethyl Glycol-Propyl Glycol (SYSTANE OP) Apply 1-2 drops to eye daily as needed (dry eyes). (Patient not taking: Reported on 05/23/2023)     PROBIOTIC CAPS Take 1 capsule by mouth daily.   (Patient not taking: Reported on 05/23/2023)     Current Facility-Administered Medications  Medication Dose Route Frequency Provider Last Rate Last Admin   0.9 %  sodium chloride infusion  500 mL Intravenous Once [place]ville Hibberd, Carie Caddy, MD        Allergies as of 06/20/2023 - Review Complete 06/20/2023  Allergen Reaction Noted   Azithromycin dihydrate  03/28/2023   Procaine hcl Other (See Comments) 06/03/2011    Family History  Problem Relation Age of Onset   Breast cancer Mother 76   Arthritis Mother        Osteoarthritis   Diabetes Mother        Type 2   Cancer Father        Lung   Colon cancer Neg Hx    Stomach cancer Neg Hx    Rectal cancer Neg Hx  Colon polyps Neg Hx    Esophageal cancer Neg Hx     Social History   Socioeconomic History   Marital status: Single    Spouse name: Not on file   Number of children: Not on file   Years of education: Not on file   Highest education level: Not on file  Occupational History   Not on file  Tobacco Use   Smoking status: Never   Smokeless tobacco: Never  Vaping Use   Vaping status: Never Used  Substance and Sexual Activity   Alcohol use: No    Alcohol/week: 0.0 standard drinks of alcohol   Drug use: No   Sexual activity: Not on file  Other Topics Concern   Not on file  Social History Narrative   Not on file   Social Determinants of Health   Financial Resource Strain: Not on file  Food Insecurity: Not on file  Transportation  Needs: Not on file  Physical Activity: Not on file  Stress: Not on file  Social Connections: Not on file  Intimate Partner Violence: Not on file    Physical Exam: Vital signs in last 24 hours: @BP  (!) 140/91   Pulse 76   Temp (!) 97.2 F (36.2 C) (Temporal)   Ht 5\' 8"  (1.727 m)   Wt 203 lb 12.8 oz (92.4 kg)   SpO2 96%   BMI 30.99 kg/m  GEN: NAD EYE: Sclerae anicteric ENT: MMM CV: Non-tachycardic Pulm: CTA b/l GI: Soft, NT/ND NEURO:  Alert & Oriented x 3   Erick Blinks, MD [facility]  06/20/2023 8:29 AM

## 2023-06-20 NOTE — Patient Instructions (Signed)
Educational handout provided to patient related to Polyps and Diverticulosis  Resume previous diet  Continue present medications  Awaiting pathology results  YOU HAD AN ENDOSCOPIC PROCEDURE [facility] AT THE [facility]:   Refer to the procedure report that was given to you for any specific questions about what was found during the examination.  If the procedure report does not answer your questions, please call your gastroenterologist to clarify.  If you requested that your care partner not be given the details of your procedure findings, then the procedure report has been included in a sealed envelope for you to review at your convenience later.  YOU SHOULD EXPECT: Some feelings of bloating in the abdomen. Passage of more gas than usual.  Walking can help get rid of the air that was put into your GI tract during the procedure and reduce the bloating. If you had a lower endoscopy (such as a colonoscopy or flexible sigmoidoscopy) you may notice spotting of blood in your stool or on the toilet paper. If you underwent a bowel prep for your procedure, you may not have a normal bowel movement for a few days.  Please Note:  You might notice some irritation and congestion in your nose or some drainage.  This is from the oxygen used during your procedure.  There is no need for concern and it should clear up in a day or so.  SYMPTOMS TO REPORT IMMEDIATELY:  Following lower endoscopy (colonoscopy or flexible sigmoidoscopy):  Excessive amounts of blood in the stool  Significant tenderness or worsening of abdominal pains  Swelling of the abdomen that is new, acute  Fever of 100F or higher  For urgent or emergent issues, a gastroenterologist can be reached at any hour by calling [phone]. Do not use MyChart messaging for urgent concerns.    DIET:  We do recommend a small meal at first, but then you may proceed to your regular diet.  Drink plenty of fluids but you should avoid alcoholic  beverages for 24 hours.  ACTIVITY:  You should plan to take it easy for the rest of today and you should NOT DRIVE or use heavy machinery until tomorrow (because of the sedation medicines used during the test).    FOLLOW UP: Our staff will call the number listed on your records the next business day following your procedure.  We will call around 7:15- 8:00 am to check on you and address any questions or concerns that you may have regarding the information given to you following your procedure. If we do not reach you, we will leave a message.     If any biopsies were taken you will be contacted by phone or by letter within the next 1-3 weeks.  Please call us at [phone] if you have not heard about the biopsies in 3 weeks.    SIGNATURES/CONFIDENTIALITY: You and/or your care partner have signed paperwork which will be entered into your electronic medical record.  These signatures attest to the fact that that the information above on your After Visit Summary has been reviewed and is understood.  Full responsibility of the confidentiality of this discharge information lies with you and/or your care-partner.

## 2023-06-20 NOTE — Op Note (Signed)
[facility] Patient Name: [name] Procedure Date: 06/20/2023 8:28 AM MRN: [id number] Endoscopist: Beverley Fiedler , MD, [id number] Age: 72 Referring MD:  Date of Birth: August 24, 1950 Gender: Female Account #: [id number] Procedure:                Colonoscopy Indications:              High risk colon cancer surveillance: Personal                            history of sessile serrated colon polyp (less than                            10 mm in size) with no dysplasia, Last colonoscopy:                            January 2016 (SSP x 1, TA x 1) Medicines:                Monitored Anesthesia Care Procedure:                Pre-Anesthesia Assessment:                           - Prior to the procedure, a History and Physical                            was performed, and patient medications and                            allergies were reviewed. The patient's tolerance of                            previous anesthesia was also reviewed. The risks                            and benefits of the procedure and the sedation                            options and risks were discussed with the patient.                            All questions were answered, and informed consent                            was obtained. Prior Anticoagulants: The patient has                            taken no anticoagulant or antiplatelet agents. ASA                            Grade Assessment: II - A patient with mild systemic                            disease. After reviewing the risks and benefits,  the patient was deemed in satisfactory condition to                            undergo the procedure.                           After obtaining informed consent, the colonoscope                            was passed under direct vision. Throughout the                            procedure, the patient's blood pressure, pulse, and                            oxygen saturations were  monitored continuously. The                            Olympus Scope SN: [id number] was introduced through                            the anus and advanced to the cecum, identified by                            appendiceal orifice and ileocecal valve. The                            colonoscopy was performed without difficulty. The                            patient tolerated the procedure well. The quality                            of the bowel preparation was good. The ileocecal                            valve, appendiceal orifice, and rectum were                            photographed. Scope In: 8:43:16 AM Scope Out: 9:00:23 AM Scope Withdrawal Time: 0 hours 14 minutes 27 seconds  Total Procedure Duration: 0 hours 17 minutes 7 seconds  Findings:                 The digital rectal exam was normal.                           A 4 mm polyp was found in the hepatic flexure. The                            polyp was sessile. The polyp was removed with a                            cold snare. Resection and retrieval were complete.  Two sessile polyps were found in the transverse                            colon. The polyps were 5 to 7 mm in size. These                            polyps were removed with a cold snare. Resection                            and retrieval were complete.                           Multiple large-mouthed, medium-mouthed and                            small-mouthed diverticula were found in the sigmoid                            colon, descending colon and transverse colon.                           Internal hemorrhoids were found during                            retroflexion. The hemorrhoids were medium-sized. Complications:            No immediate complications. Estimated Blood Loss:     Estimated blood loss: none. Impression:               - One 4 mm polyp at the hepatic flexure, removed                            with a cold snare. Resected  and retrieved.                           - Two 5 to 7 mm polyps in the transverse colon,                            removed with a cold snare. Resected and retrieved.                           - Moderate diverticulosis in the sigmoid colon, in                            the descending colon and in the transverse colon.                           - Internal hemorrhoids. Recommendation:           - Patient has a contact number available for                            emergencies. The signs and symptoms of potential  delayed complications were discussed with the                            patient. Return to normal activities tomorrow.                            Written discharge instructions were provided to the                            patient.                           - Resume previous diet.                           - Continue present medications.                           - Await pathology results.                           - Repeat colonoscopy is recommended for                            surveillance. The colonoscopy date will be                            determined after pathology results from today's                            exam become available for review. Beverley Fiedler, MD 06/20/2023 9:03:43 AM This report has been signed electronically.

## 2023-06-20 NOTE — Progress Notes (Signed)
Report to PACU, RN, vss, BBS= Clear.  

## 2023-06-20 NOTE — Progress Notes (Signed)
Pt's states no medical or surgical changes since previsit or office visit. 

## 2023-06-20 NOTE — Progress Notes (Signed)
Called to room to assist during endoscopic procedure.  Patient ID and intended procedure confirmed with present staff. Received instructions for my participation in the procedure from the performing physician.  

## 2023-06-21 ENCOUNTER — Telehealth: Payer: Self-pay

## 2023-06-21 NOTE — Telephone Encounter (Signed)
  Follow up Call-     06/20/2023    7:35 AM  Call back number  Post procedure Call Back phone  # [phone]  Permission to leave phone message Yes     Left message

## 2023-06-22 LAB — SURGICAL PATHOLOGY

## 2023-06-27 ENCOUNTER — Encounter: Payer: Self-pay | Admitting: Internal Medicine
